# Patient Record
Sex: Male | Born: 1995 | Race: White | Hispanic: No | Marital: Single | Smoking: Current every day smoker
Health system: Southern US, Community
[De-identification: ages and names within clinical notes are randomized; demographics above are authoritative.]

## PROBLEM LIST (undated history)

## (undated) DIAGNOSIS — F319 Bipolar disorder, unspecified: Secondary | ICD-10-CM

## (undated) DIAGNOSIS — D499 Neoplasm of unspecified behavior of unspecified site: Secondary | ICD-10-CM

## (undated) DIAGNOSIS — F909 Attention-deficit hyperactivity disorder, unspecified type: Secondary | ICD-10-CM

## (undated) DIAGNOSIS — G43909 Migraine, unspecified, not intractable, without status migrainosus: Secondary | ICD-10-CM

## (undated) DIAGNOSIS — F419 Anxiety disorder, unspecified: Secondary | ICD-10-CM

## (undated) DIAGNOSIS — F32A Depression, unspecified: Secondary | ICD-10-CM

## (undated) DIAGNOSIS — E119 Type 2 diabetes mellitus without complications: Secondary | ICD-10-CM

## (undated) DIAGNOSIS — F6381 Intermittent explosive disorder: Secondary | ICD-10-CM

## (undated) DIAGNOSIS — F329 Major depressive disorder, single episode, unspecified: Secondary | ICD-10-CM

## (undated) DIAGNOSIS — T1490XA Injury, unspecified, initial encounter: Secondary | ICD-10-CM

## (undated) DIAGNOSIS — C801 Malignant (primary) neoplasm, unspecified: Secondary | ICD-10-CM

## (undated) DIAGNOSIS — R251 Tremor, unspecified: Secondary | ICD-10-CM

## (undated) HISTORY — DX: Type 2 diabetes mellitus without complications: E11.9

## (undated) HISTORY — DX: Injury, unspecified, initial encounter: T14.90XA

## (undated) HISTORY — DX: Anxiety disorder, unspecified: F41.9

## (undated) HISTORY — PX: MOUTH SURGERY: SHX715

## (undated) HISTORY — PX: FRACTURE SURGERY: SHX138

## (undated) HISTORY — DX: Neoplasm of unspecified behavior of unspecified site: D49.9

## (undated) HISTORY — PX: TONSILLECTOMY: SUR1361

## (undated) HISTORY — DX: Depression, unspecified: F32.A

## (undated) HISTORY — PX: ADENOIDECTOMY: SUR15

## (undated) HISTORY — PX: TUMOR REMOVAL: SHX12

## (undated) HISTORY — DX: Bipolar disorder, unspecified: F31.9

## (undated) HISTORY — DX: Intermittent explosive disorder: F63.81

## (undated) HISTORY — DX: Major depressive disorder, single episode, unspecified: F32.9

---
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
## 2017-05-19 ENCOUNTER — Other Ambulatory Visit: Payer: Self-pay

## 2017-05-19 ENCOUNTER — Emergency Department (HOSPITAL_COMMUNITY)
Admission: EM | Admit: 2017-05-19 | Discharge: 2017-05-20 | Disposition: A | Discharge status: Home / Self Care | Payer: Medicaid Other | Attending: Emergency Medicine | Admitting: Emergency Medicine

## 2017-05-19 ENCOUNTER — Emergency Department (HOSPITAL_COMMUNITY): Payer: Medicaid Other

## 2017-05-19 ENCOUNTER — Encounter (HOSPITAL_COMMUNITY): Payer: Self-pay | Admitting: *Deleted

## 2017-05-19 DIAGNOSIS — Z79899 Other long term (current) drug therapy: Secondary | ICD-10-CM | POA: Insufficient documentation

## 2017-05-19 DIAGNOSIS — R519 Headache, unspecified: Secondary | ICD-10-CM

## 2017-05-19 DIAGNOSIS — F17200 Nicotine dependence, unspecified, uncomplicated: Secondary | ICD-10-CM | POA: Insufficient documentation

## 2017-05-19 DIAGNOSIS — R51 Headache: Secondary | ICD-10-CM | POA: Insufficient documentation

## 2017-05-19 HISTORY — DX: Migraine, unspecified, not intractable, without status migrainosus: G43.909

## 2017-05-19 MED ORDER — BUTALBITAL-APAP-CAFFEINE 50-325-40 MG PO TABS: 1.0000 | ORAL_TABLET | Freq: Four times a day (QID) | ORAL | 0 refills | Status: DC | PRN

## 2017-05-19 MED ORDER — KETOROLAC TROMETHAMINE 30 MG/ML IJ SOLN
30.0000 mg | Freq: Once | INTRAMUSCULAR | Status: AC
  Administered 2017-05-19: 30 mg via INTRAVENOUS
  Filled 2017-05-19: qty 1

## 2017-05-19 MED ORDER — METOCLOPRAMIDE HCL 5 MG/ML IJ SOLN
10.0000 mg | Freq: Once | INTRAMUSCULAR | Status: AC
  Administered 2017-05-19: 10 mg via INTRAVENOUS
  Filled 2017-05-19: qty 2

## 2017-05-19 MED ORDER — DIPHENHYDRAMINE HCL 50 MG/ML IJ SOLN
25.0000 mg | Freq: Once | INTRAMUSCULAR | Status: AC
  Administered 2017-05-19: 25 mg via INTRAVENOUS
  Filled 2017-05-19: qty 1

## 2017-05-19 MED ORDER — SODIUM CHLORIDE 0.9 % IV BOLUS (SEPSIS)
1000.0000 mL | Freq: Once | INTRAVENOUS | Status: AC
  Administered 2017-05-19: 1000 mL via INTRAVENOUS

## 2017-05-19 NOTE — ED Triage Notes (Signed)
Pt went to work last night, developed headache, had nose bleed and numbness in rt side of face. Pain in rt face and head. Nose bleed on and off today.

## 2017-05-19 NOTE — ED Provider Notes (Signed)
[facility] DEPT Provider Note   CSN: [id number] Arrival date & time: 05/19/17  1853     History   Chief Complaint Chief Complaint  Patient presents with  . Headache    HPI Danny Werner is a 22 y.o. male.  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  Patient reports gradual onset of throbbing right-sided headache ongoing since last night.  Headache is waxing and waning, initially more intense but has improved.  Headache is currently 7 out of 10, reported tingling sensation to the right side of the face as well as having intermittent nosebleed from the right naris.  Headache is mildly improved with taking Advil.  Headache is different from his usual migraine headache.  No report of fever, vision changes, double vision, URI symptoms, neck pain, chest pain, trouble breathing, arm numbness or weakness or rash.  He has never experiencing tingling sensation to his face before.  Denies any precipitating symptoms contributing to his headache.  Past Medical History:  Diagnosis Date  . ADHD (attention deficit hyperactivity disorder)   . Cancer ([place])   . Migraines     There are no active problems to display for this patient.   Past Surgical History:  Procedure Laterality Date  . ADENOIDECTOMY    . ANAL RECTAL MANOMETRY N/A 01/18/2016   Procedure: ANO RECTAL MANOMETRY;  Surgeon: Arta Silence, MD;  Location: [facility];  Service: Endoscopy;  Laterality: N/A;  . FRACTURE SURGERY    . MOUTH SURGERY    . TONSILLECTOMY         Home Medications    Prior to Admission medications   Medication Sig Start Date End Date Taking? Authorizing Provider  ARIPiprazole (ABILIFY) 5 MG tablet Take 5 mg by mouth daily.    [provider]  dexmethylphenidate (FOCALIN) 5 MG tablet Take 5 mg by mouth daily with lunch. Only on school days    [provider]  GuanFACINE HCl (INTUNIV) 3 MG TB24 Take 1 tablet by mouth daily.    [provider]  HYDROcodone-acetaminophen (NORCO/VICODIN) 5-325 MG per tablet Take 1 tablet by mouth every 6 (six) hours as needed for pain. Patient not taking: Reported on 08/08/2014 09/03/12   Blanchie Dessert, MD  methylphenidate (METADATE CD) 60 MG CR capsule Take 45 mg by mouth every morning.     [provider]    Family History Family History  Problem Relation Age of Onset  . Asthma Mother     Social History Social History   Tobacco Use  . Smoking status: Current Every Day Smoker  . Smokeless tobacco: Never Used  Substance Use Topics  . Alcohol use: No  . Drug use: No     Allergies   Penicillins and Promethazine hcl   Review of Systems Review of Systems  All other systems reviewed and are negative.    Physical Exam Updated Vital Signs BP 130/69 (BP Location: Left Arm)   Pulse 77   Temp 98.5 F (36.9 C) (Oral)   Resp 20   Ht 5\' 3"  (1.6 m)   Wt 87.5 kg (193 lb)   SpO2 99%   BMI 34.19 kg/m   Physical Exam  Constitutional: He is oriented to person, place, and time. He appears well-developed and well-nourished. No distress.  HENT:  Head: Normocephalic and atraumatic.  Mouth/Throat: Oropharynx is clear and moist.  Normal nares, no epistaxis.  Eyes: Conjunctivae and EOM are normal. Pupils are equal, round, and reactive to light.  Neck: Normal range of motion. Neck supple. No neck rigidity.  Cardiovascular: Normal rate and regular rhythm.  Pulmonary/Chest: Effort normal and breath sounds normal.  Abdominal: Soft. There is no tenderness.  Musculoskeletal: Normal range of motion.  Neurological: He is alert and oriented to person, place, and time. He is not disoriented. He displays normal reflexes. A sensory deficit (Subjective hypersensitivity to right side of face on gentle palpation) is present. No cranial nerve deficit. GCS eye subscore is 4. GCS verbal subscore is 5.  GCS motor subscore is 6.  Skin: Skin is warm. No rash noted.  Psychiatric: He has a normal mood and affect.  Nursing note and vitals reviewed.    ED Treatments / Results  Labs (all labs ordered are listed, but only abnormal results are displayed) Labs Reviewed - No data to display  EKG  EKG Interpretation None       Radiology Ct Head Wo Contrast  Result Date: 05/19/2017 CLINICAL DATA:  Pain and right-sided face and head. Intermittent epistaxis. EXAM: CT HEAD WITHOUT CONTRAST TECHNIQUE: Contiguous axial images were obtained from the base of the skull through the vertex without intravenous contrast. COMPARISON:  None. FINDINGS: Brain: No evidence of acute infarction, hemorrhage, hydrocephalus, extra-axial collection or mass lesion/mass effect. Vascular: No hyperdense vessel or unexpected calcification. Skull: Normal. Negative for fracture or focal lesion. Sinuses/Orbits: No acute finding. Other: None. IMPRESSION: Normal study.  No cause for the patient's symptoms identified. Electronically Signed   By: Dorise Bullion III M.D   On: 05/19/2017 23:16    Procedures Procedures (including critical care time)  Medications Ordered in ED Medications  ketorolac (TORADOL) 30 MG/ML injection 30 mg (30 mg Intravenous Given 05/19/17 2340)  diphenhydrAMINE (BENADRYL) injection 25 mg (25 mg Intravenous Given 05/19/17 2339)  metoCLOPramide (REGLAN) injection 10 mg (10 mg Intravenous Given 05/19/17 2338)  sodium chloride 0.9 % bolus 1,000 mL (1,000 mLs Intravenous New Bag/Given 05/19/17 2337)     Initial Impression / Assessment and Plan / ED Course  I have reviewed the triage vital signs and the nursing notes.  Pertinent labs & imaging results that were available during my care of the patient were reviewed by me and considered in my medical decision making (see chart for details).     BP 130/69 (BP Location: Left Arm)   Pulse 77   Temp 98.5 F (36.9 C) (Oral)   Resp 20   Ht 5\' 3"  (1.6 m)   Wt  87.5 kg (193 lb)   SpO2 99%   BMI 34.19 kg/m    Final Clinical Impressions(s) / ED Diagnoses   Final diagnoses:  Bad headache    ED Discharge Orders        Ordered    butalbital-acetaminophen-caffeine (FIORICET, ESGIC) 50-325-40 MG tablet  Every 6 hours PRN     05/19/17 2359     10:41 PM Pt with hx of migraine headache here with headache that felt different from usual.  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  Was told that it's a non aggressive type of tumor and he's been monitoring at [facility].  Given that hx, I plan to obtain head CT to r/o space occupying lesion.  Report intermittent nose bleed, none currently.  Migraine cocktail given.   11:55 PM Head CT scan without any concerning feature.  Patient felt better with treatment.  He is stable for discharge.  Return  precautions discussed.   Domenic Moras, PA-C 05/19/17 2359    Valarie Merino, MD 05/20/17 [phone]

## 2017-10-05 ENCOUNTER — Other Ambulatory Visit: Payer: Self-pay

## 2017-10-05 ENCOUNTER — Observation Stay (HOSPITAL_COMMUNITY)
Admission: EM | Admit: 2017-10-05 | Discharge: 2017-10-07 | Disposition: A | Discharge status: Home / Self Care | Payer: Medicaid Other | Attending: Internal Medicine | Admitting: Internal Medicine

## 2017-10-05 ENCOUNTER — Encounter (HOSPITAL_COMMUNITY): Payer: Self-pay | Admitting: Emergency Medicine

## 2017-10-05 DIAGNOSIS — Z9889 Other specified postprocedural states: Secondary | ICD-10-CM | POA: Insufficient documentation

## 2017-10-05 DIAGNOSIS — F17200 Nicotine dependence, unspecified, uncomplicated: Secondary | ICD-10-CM | POA: Diagnosis not present

## 2017-10-05 DIAGNOSIS — R1031 Right lower quadrant pain: Secondary | ICD-10-CM

## 2017-10-05 DIAGNOSIS — Z79899 Other long term (current) drug therapy: Secondary | ICD-10-CM | POA: Insufficient documentation

## 2017-10-05 DIAGNOSIS — E1165 Type 2 diabetes mellitus with hyperglycemia: Secondary | ICD-10-CM | POA: Diagnosis not present

## 2017-10-05 DIAGNOSIS — R338 Other retention of urine: Secondary | ICD-10-CM | POA: Diagnosis present

## 2017-10-05 DIAGNOSIS — Z880 Allergy status to penicillin: Secondary | ICD-10-CM | POA: Insufficient documentation

## 2017-10-05 DIAGNOSIS — M8560 Other cyst of bone, unspecified site: Secondary | ICD-10-CM | POA: Diagnosis not present

## 2017-10-05 DIAGNOSIS — Z833 Family history of diabetes mellitus: Secondary | ICD-10-CM | POA: Diagnosis not present

## 2017-10-05 DIAGNOSIS — R634 Abnormal weight loss: Secondary | ICD-10-CM | POA: Diagnosis not present

## 2017-10-05 DIAGNOSIS — R112 Nausea with vomiting, unspecified: Secondary | ICD-10-CM

## 2017-10-05 DIAGNOSIS — N2881 Hypertrophy of kidney: Secondary | ICD-10-CM | POA: Diagnosis not present

## 2017-10-05 DIAGNOSIS — Z885 Allergy status to narcotic agent status: Secondary | ICD-10-CM | POA: Insufficient documentation

## 2017-10-05 DIAGNOSIS — R109 Unspecified abdominal pain: Secondary | ICD-10-CM | POA: Diagnosis present

## 2017-10-05 DIAGNOSIS — Z888 Allergy status to other drugs, medicaments and biological substances status: Secondary | ICD-10-CM | POA: Insufficient documentation

## 2017-10-05 DIAGNOSIS — M8540 Solitary bone cyst, unspecified site: Secondary | ICD-10-CM | POA: Diagnosis present

## 2017-10-05 DIAGNOSIS — E119 Type 2 diabetes mellitus without complications: Secondary | ICD-10-CM

## 2017-10-05 DIAGNOSIS — G43909 Migraine, unspecified, not intractable, without status migrainosus: Secondary | ICD-10-CM | POA: Diagnosis not present

## 2017-10-05 DIAGNOSIS — F909 Attention-deficit hyperactivity disorder, unspecified type: Secondary | ICD-10-CM | POA: Diagnosis present

## 2017-10-05 DIAGNOSIS — F319 Bipolar disorder, unspecified: Secondary | ICD-10-CM | POA: Diagnosis not present

## 2017-10-05 LAB — COMPREHENSIVE METABOLIC PANEL
ALT: 16 U/L — ABNORMAL LOW (ref 17–63)
AST: 17 U/L (ref 15–41)
Albumin: 4.4 g/dL (ref 3.5–5.0)
Alkaline Phosphatase: 209 U/L — ABNORMAL HIGH (ref 38–126)
Anion gap: 11 (ref 5–15)
BUN: 15 mg/dL (ref 6–20)
CHLORIDE: 100 mmol/L — AB (ref 101–111)
CO2: 28 mmol/L (ref 22–32)
Calcium: 9.6 mg/dL (ref 8.9–10.3)
Creatinine, Ser: 0.8 mg/dL (ref 0.61–1.24)
GFR calc Af Amer: >60 mL/min (ref >60)
GFR calc non Af Amer: >60 mL/min (ref >60)
Glucose, Bld: 582 mg/dL (ref 65–99)
Potassium: 3.9 mmol/L (ref 3.5–5.1)
SODIUM: 139 mmol/L (ref 135–145)
Total Bilirubin: 1.3 mg/dL — ABNORMAL HIGH (ref 0.3–1.2)
Total Protein: 7.4 g/dL (ref 6.5–8.1)

## 2017-10-05 LAB — LIPASE, BLOOD: Lipase: 33 U/L (ref 11–51)

## 2017-10-05 MED ORDER — MORPHINE SULFATE (PF) 4 MG/ML IV SOLN
4.0000 mg | Freq: Once | INTRAVENOUS | Status: AC
  Administered 2017-10-05: 4 mg via INTRAVENOUS
  Filled 2017-10-05: qty 1

## 2017-10-05 MED ORDER — SODIUM CHLORIDE 0.9 % IV BOLUS
1000.0000 mL | Freq: Once | INTRAVENOUS | Status: AC
  Administered 2017-10-05: 1000 mL via INTRAVENOUS

## 2017-10-05 MED ORDER — IOPAMIDOL (ISOVUE-300) INJECTION 61%
INTRAVENOUS | Status: AC
  Filled 2017-10-05: qty 100

## 2017-10-05 MED ORDER — ONDANSETRON HCL 4 MG/2ML IJ SOLN
4.0000 mg | Freq: Once | INTRAMUSCULAR | Status: AC
  Administered 2017-10-05: 4 mg via INTRAVENOUS
  Filled 2017-10-05: qty 2

## 2017-10-05 NOTE — ED Provider Notes (Signed)
Patient signed out to me at shift change.  Patient with right flank pain and right lower quadrant pain for several days.  Reports associated nausea and vomiting.  Reports night sweats.  Weight loss of 50 pounds in the last 3 weeks.  Polyuria polydipsia.  Pending labs and CT abdomen pelvis for further evaluation.   Patient's glucose came back at 582.  Most likely new onset of diabetes.  Anion gap is normal, 11.  The rest of the electrolytes are unremarkable.  CBC is normal.  CT scan showed distended bladder with dilated renal pelvises, may be related to patient's flank pain.  Postvoid residual obtained and showed patient had over 400 cc of urine after voiding in his bladder.  I will order placement of Foley catheter for now.  He will need urology consult.  Patient received 2 L of IV fluids, recheck CBG is 349.  I will order insulin.  Given new onset diabetes is a 22 year old, question type 1 diabetes.  He will need further inpatient work-up as well as initiation of treatment.  Patient is also telling me that he was seen last week at [facility] emergency department for bee sting where he received apparently EpiPen and steroids.  Do not see any records of that in our chart.  I tried to discharge patient by his name to see if he has another medical record number but unable to find him at this time.  Will call for admission.    Jeannett Senior, PA-C 10/06/17 Sesser, [place], MD 10/06/17 [phone]

## 2017-10-05 NOTE — ED Notes (Signed)
Patient is aware that we need a urine sample.    

## 2017-10-05 NOTE — ED Triage Notes (Signed)
Pt arriving with right sided flank pain that began 1.5 hours ago. Pt denies taking anything for pain prior to arrival. N/V. Denies any urinary symptoms.

## 2017-10-05 NOTE — ED Provider Notes (Signed)
[facility] DEPT Provider Note   CSN: [id number] Arrival date & time: 10/05/17  2058     History   Chief Complaint Chief Complaint  Patient presents with  . Flank Pain    HPI Danny Werner is a 22 y.o. male.  Danny Werner is a 22 y.o. Male with a history of ADHD, migraines and left humeral unicameral bone cyst followed by [facility], who presents to the emergency department for evaluation of acute onset right flank and right lower quadrant abdominal pain that started this afternoon he reports pain is a constant dull ache that occasionally becomes more sharp.  Patient reports associated nausea with one episode of nonbloody emesis.  He denies associated fevers, no diarrhea, constipation, hematochezia or melena.  He denies any dysuria or urinary frequency.  Personal history of kidney stones, but family history.  Patient's mother is at the bedside and also notes that patient has lost probably about 55 pounds in the last 3 weeks without trying, he is on ADHD medications but these are not new and has not been recently changed.  He reports some intermittent night sweats, but denies fevers or chills, has not been experiencing any abdominal pain over the last several weeks.  Mom does report some intermittent vomiting but she attributed this to him choking drinks very quickly.     Past Medical History:  Diagnosis Date  . ADHD (attention deficit hyperactivity disorder)   . Cancer ([place])    tumor in left arm  . Migraines     There are no active problems to display for this patient.   Past Surgical History:  Procedure Laterality Date  . ADENOIDECTOMY    . ANAL RECTAL MANOMETRY N/A 01/18/2016   Procedure: ANO RECTAL MANOMETRY;  Surgeon: Arta Silence, MD;  Location: [facility];  Service: Endoscopy;  Laterality: N/A;  . FRACTURE SURGERY    . MOUTH SURGERY    . TONSILLECTOMY          Home Medications    Prior to Admission medications   Medication Sig  Start Date End Date Taking? Authorizing Provider  ARIPiprazole (ABILIFY) 5 MG tablet Take 5 mg by mouth daily.    [provider]  butalbital-acetaminophen-caffeine (FIORICET, ESGIC) 50-325-40 MG tablet Take 1 tablet by mouth every 6 (six) hours as needed for headache. 05/19/17 05/19/18  Domenic Moras, PA-C  dexmethylphenidate (FOCALIN) 5 MG tablet Take 5 mg by mouth daily with lunch. Only on school days    [provider]  GuanFACINE HCl (INTUNIV) 3 MG TB24 Take 1 tablet by mouth daily.    [provider]  methylphenidate (METADATE CD) 60 MG CR capsule Take 45 mg by mouth every morning.     [provider]    Family History Family History  Problem Relation Age of Onset  . Asthma Mother     Social History Social History   Tobacco Use  . Smoking status: Current Every Day Smoker  . Smokeless tobacco: Never Used  Substance Use Topics  . Alcohol use: No  . Drug use: No     Allergies   Codeine; Penicillins; and Promethazine hcl   Review of Systems Review of Systems  Constitutional: Positive for unexpected weight change. Negative for chills and fever.  HENT: Negative for congestion, sinus pain and sore throat.   Eyes: Negative for visual disturbance.  Respiratory: Negative for cough and shortness of breath.   Cardiovascular: Negative for chest pain.  Gastrointestinal: Positive for abdominal pain,  nausea and vomiting. Negative for blood in stool, constipation and diarrhea.  Genitourinary: Negative for dysuria and frequency.  Musculoskeletal: Negative for arthralgias and joint swelling.  Skin: Negative for color change and rash.  Neurological: Negative for syncope and light-headedness.  All other systems reviewed and are negative.    Physical Exam Updated Vital Signs BP 119/75 (BP Location: Right Arm)   Pulse 82   Temp 98.1 F (36.7 C) (Oral)   Resp 17   Ht 5\' 2"  (1.575 m)   Wt 58.5 kg (129 lb)   SpO2 95%   BMI 23.59 kg/m   Physical  Exam  Constitutional: He is oriented to person, place, and time. He appears well-developed and well-nourished. No distress.  HENT:  Head: Normocephalic and atraumatic.  Left Ear: External ear normal.  Eyes: Right eye exhibits no discharge. Left eye exhibits no discharge.  Neck: Neck supple.  Cardiovascular: Normal rate, regular rhythm, normal heart sounds and intact distal pulses.  Pulmonary/Chest: Effort normal and breath sounds normal. No stridor. No respiratory distress. He has no wheezes. He has no rales.  Respirations equal and unlabored, patient able to speak in full sentences, lungs clear to auscultation bilaterally  Abdominal: Soft. Bowel sounds are normal. He exhibits distension. He exhibits no mass. There is tenderness. There is guarding. There is no rebound.  Abdomen soft, nondistended, bowel sounds present throughout, focal tenderness in the right lower quadrant with guarding, no rebound tenderness, all other quadrants nontender to palpation, there is no CVA tenderness bilaterally  Musculoskeletal: He exhibits no edema or deformity.  Neurological: He is alert and oriented to person, place, and time. Coordination normal.  Skin: Skin is warm and dry. Capillary refill takes less than 2 seconds. He is not diaphoretic.  Psychiatric: He has a normal mood and affect. His behavior is normal.  Nursing note and vitals reviewed.    ED Treatments / Results  Labs (all labs ordered are listed, but only abnormal results are displayed) Labs Reviewed  COMPREHENSIVE METABOLIC PANEL  LIPASE, BLOOD  CBC WITH DIFFERENTIAL/PLATELET  URINALYSIS, ROUTINE W REFLEX MICROSCOPIC    EKG None  Radiology No results found.  Procedures Procedures (including critical care time)  Medications Ordered in ED Medications  iopamidol (ISOVUE-300) 61 % injection (has no administration in time range)  iopamidol (ISOVUE-300) 61 % injection 100 mL (has no administration in time range)  sodium chloride 0.9  % bolus 1,000 mL (1,000 mLs Intravenous New Bag/Given 10/05/17 2257)  ondansetron (ZOFRAN) injection 4 mg (4 mg Intravenous Given 10/05/17 2304)  morphine 4 MG/ML injection 4 mg (4 mg Intravenous Given 10/05/17 2304)     Initial Impression / Assessment and Plan / ED Course  I have reviewed the triage vital signs and the nursing notes.  Pertinent labs & imaging results that were available during my care of the patient were reviewed by me and considered in my medical decision making (see chart for details).  Patient presents to the ED for evaluation of acute onset of right lower quadrant abdominal pain with associated nausea and vomiting.  No fevers or chills, no diarrhea or blood in the stool, no urinary symptoms.  Mom also notes approximate 55 pound weight loss in the last 3 weeks, patient reports some associated night sweats. vital signs are normal and patient appears uncomfortable but in no acute distress.  Abdomen is focally tender in the right lower quadrant.  Concerning for appendicitis versus kidney stone versus other acute intra-abdominal pathology.  Unclear etiology for night  sweats and weight loss, patient does have history of unicameral bone cyst which has been monitored closely but no history of malignancy.  Will get abdominal labs and CT abdomen pelvis with contrast.  Patient given IV fluids, morphine and Zofran for symptomatic management.  At shift change care signed out to PA [facility] who will will follow up on lab work and CT scan and disposition appropriately.  Final Clinical Impressions(s) / ED Diagnoses   Final diagnoses:  RLQ abdominal pain  Non-intractable vomiting with nausea, unspecified vomiting type  Weight loss    ED Discharge Orders    None       Janet Berlin 10/06/17 0037    Tanna Furry, MD 10/10/17 1446

## 2017-10-06 ENCOUNTER — Encounter (HOSPITAL_COMMUNITY): Payer: Self-pay | Admitting: *Deleted

## 2017-10-06 ENCOUNTER — Emergency Department (HOSPITAL_COMMUNITY): Payer: Medicaid Other

## 2017-10-06 DIAGNOSIS — M8540 Solitary bone cyst, unspecified site: Secondary | ICD-10-CM | POA: Diagnosis not present

## 2017-10-06 DIAGNOSIS — F909 Attention-deficit hyperactivity disorder, unspecified type: Secondary | ICD-10-CM | POA: Diagnosis present

## 2017-10-06 DIAGNOSIS — E119 Type 2 diabetes mellitus without complications: Secondary | ICD-10-CM | POA: Diagnosis not present

## 2017-10-06 DIAGNOSIS — R338 Other retention of urine: Secondary | ICD-10-CM | POA: Diagnosis not present

## 2017-10-06 DIAGNOSIS — R634 Abnormal weight loss: Secondary | ICD-10-CM | POA: Diagnosis not present

## 2017-10-06 DIAGNOSIS — R109 Unspecified abdominal pain: Secondary | ICD-10-CM | POA: Diagnosis present

## 2017-10-06 DIAGNOSIS — R112 Nausea with vomiting, unspecified: Secondary | ICD-10-CM

## 2017-10-06 DIAGNOSIS — R1031 Right lower quadrant pain: Secondary | ICD-10-CM | POA: Diagnosis not present

## 2017-10-06 LAB — HEMOGLOBIN A1C
HEMOGLOBIN A1C: 13.6 % — AB (ref 4.8–5.6)
Mean Plasma Glucose: 343.62 mg/dL

## 2017-10-06 LAB — COMPREHENSIVE METABOLIC PANEL
ALT: 13 U/L — ABNORMAL LOW (ref 17–63)
AST: 15 U/L (ref 15–41)
Albumin: 3.7 g/dL (ref 3.5–5.0)
Alkaline Phosphatase: 144 U/L — ABNORMAL HIGH (ref 38–126)
Anion gap: 9 (ref 5–15)
BUN: 13 mg/dL (ref 6–20)
CO2: 26 mmol/L (ref 22–32)
Calcium: 8.8 mg/dL — ABNORMAL LOW (ref 8.9–10.3)
Chloride: 110 mmol/L (ref 101–111)
Creatinine, Ser: 0.66 mg/dL (ref 0.61–1.24)
GFR calc Af Amer: >60 mL/min (ref >60)
GFR calc non Af Amer: >60 mL/min (ref >60)
Glucose, Bld: 282 mg/dL — ABNORMAL HIGH (ref 65–99)
Potassium: 3.7 mmol/L (ref 3.5–5.1)
Sodium: 145 mmol/L (ref 135–145)
Total Bilirubin: 0.8 mg/dL (ref 0.3–1.2)
Total Protein: 6.1 g/dL — ABNORMAL LOW (ref 6.5–8.1)

## 2017-10-06 LAB — URINALYSIS, ROUTINE W REFLEX MICROSCOPIC
Bilirubin Urine: NEGATIVE
Glucose, UA: >=500 mg/dL — AB
Hgb urine dipstick: NEGATIVE
Ketones, ur: >80 mg/dL — AB
Leukocytes, UA: NEGATIVE
NITRITE: NEGATIVE
PROTEIN: NEGATIVE mg/dL
Specific Gravity, Urine: <1.005 — ABNORMAL LOW (ref 1.005–1.030)
pH: 6 (ref 5.0–8.0)

## 2017-10-06 LAB — CBC WITH DIFFERENTIAL/PLATELET
BASOS ABS: 0.1 10*3/uL (ref 0.0–0.1)
BASOS PCT: 2 %
EOS ABS: 0.1 10*3/uL (ref 0.0–0.7)
EOS PCT: 2 %
HCT: 44.9 % (ref 39.0–52.0)
Hemoglobin: 16.4 g/dL (ref 13.0–17.0)
LYMPHS ABS: 1.5 10*3/uL (ref 0.7–4.0)
Lymphocytes Relative: 25 %
MCH: 30.4 pg (ref 26.0–34.0)
MCHC: 36.5 g/dL — ABNORMAL HIGH (ref 30.0–36.0)
MCV: 83.1 fL (ref 78.0–100.0)
Monocytes Absolute: 0.5 10*3/uL (ref 0.1–1.0)
Monocytes Relative: 8 %
NEUTROS PCT: 63 %
Neutro Abs: 3.8 10*3/uL (ref 1.7–7.7)
PLATELETS: 305 10*3/uL (ref 150–400)
RBC: 5.4 MIL/uL (ref 4.22–5.81)
RDW: 12.4 % (ref 11.5–15.5)
WBC: 6 10*3/uL (ref 4.0–10.5)

## 2017-10-06 LAB — URINALYSIS, MICROSCOPIC (REFLEX)
Bacteria, UA: NONE SEEN
RBC / HPF: NONE SEEN RBC/hpf (ref 0–5)

## 2017-10-06 LAB — CBC
HCT: 40.4 % (ref 39.0–52.0)
Hemoglobin: 14.2 g/dL (ref 13.0–17.0)
MCH: 30 pg (ref 26.0–34.0)
MCHC: 35.1 g/dL (ref 30.0–36.0)
MCV: 85.4 fL (ref 78.0–100.0)
Platelets: 269 10*3/uL (ref 150–400)
RBC: 4.73 MIL/uL (ref 4.22–5.81)
RDW: 12.4 % (ref 11.5–15.5)
WBC: 6.7 10*3/uL (ref 4.0–10.5)

## 2017-10-06 LAB — SEDIMENTATION RATE: Sed Rate: 0 mm/hr (ref 0–16)

## 2017-10-06 LAB — GLUCOSE, CAPILLARY
GLUCOSE-CAPILLARY: 254 mg/dL — AB (ref 65–99)
GLUCOSE-CAPILLARY: 147 mg/dL — AB (ref 65–99)
Glucose-Capillary: 103 mg/dL — ABNORMAL HIGH (ref 65–99)
Glucose-Capillary: 224 mg/dL — ABNORMAL HIGH (ref 65–99)
Glucose-Capillary: 297 mg/dL — ABNORMAL HIGH (ref 65–99)

## 2017-10-06 LAB — TSH: TSH: 2.07 u[IU]/mL (ref 0.350–4.500)

## 2017-10-06 LAB — CBG MONITORING, ED: Glucose-Capillary: 349 mg/dL — ABNORMAL HIGH (ref 65–99)

## 2017-10-06 LAB — HIV ANTIBODY (ROUTINE TESTING W REFLEX): HIV SCREEN 4TH GENERATION: NONREACTIVE

## 2017-10-06 MED ORDER — ACETAMINOPHEN 325 MG PO TABS
650.0000 mg | ORAL_TABLET | Freq: Four times a day (QID) | ORAL | Status: DC | PRN
  Administered 2017-10-06 (×2): 650 mg via ORAL
  Filled 2017-10-06 (×2): qty 2

## 2017-10-06 MED ORDER — SODIUM CHLORIDE 0.9 % IV BOLUS
1000.0000 mL | Freq: Once | INTRAVENOUS | Status: AC
  Administered 2017-10-06: 1000 mL via INTRAVENOUS

## 2017-10-06 MED ORDER — INSULIN DETEMIR 100 UNIT/ML ~~LOC~~ SOLN
10.0000 [IU] | Freq: Every day | SUBCUTANEOUS | Status: DC
  Administered 2017-10-06: 10 [IU] via SUBCUTANEOUS
  Filled 2017-10-06 (×2): qty 0.1

## 2017-10-06 MED ORDER — INSULIN ASPART 100 UNIT/ML ~~LOC~~ SOLN
0.0000 [IU] | Freq: Three times a day (TID) | SUBCUTANEOUS | Status: DC
  Administered 2017-10-06: 5 [IU] via SUBCUTANEOUS

## 2017-10-06 MED ORDER — ONDANSETRON HCL 4 MG/2ML IJ SOLN: 4.0000 mg | Freq: Four times a day (QID) | INTRAMUSCULAR | Status: DC | PRN

## 2017-10-06 MED ORDER — ENOXAPARIN SODIUM 40 MG/0.4ML ~~LOC~~ SOLN
40.0000 mg | SUBCUTANEOUS | Status: DC
  Administered 2017-10-06 – 2017-10-07 (×2): 40 mg via SUBCUTANEOUS
  Filled 2017-10-06 (×2): qty 0.4

## 2017-10-06 MED ORDER — ALBUTEROL SULFATE (2.5 MG/3ML) 0.083% IN NEBU: 2.5000 mg | INHALATION_SOLUTION | Freq: Four times a day (QID) | RESPIRATORY_TRACT | Status: DC | PRN

## 2017-10-06 MED ORDER — INSULIN ASPART 100 UNIT/ML ~~LOC~~ SOLN
6.0000 [IU] | Freq: Once | SUBCUTANEOUS | Status: AC
  Administered 2017-10-06: 6 [IU] via SUBCUTANEOUS
  Filled 2017-10-06: qty 1

## 2017-10-06 MED ORDER — LIVING WELL WITH DIABETES BOOK
Freq: Once | Status: AC
  Administered 2017-10-06: 10:00:00
  Filled 2017-10-06: qty 1

## 2017-10-06 MED ORDER — SODIUM CHLORIDE 0.9 % IV SOLN
Freq: Once | INTRAVENOUS | Status: AC
  Administered 2017-10-06: 04:00:00 via INTRAVENOUS

## 2017-10-06 MED ORDER — INSULIN ASPART 100 UNIT/ML ~~LOC~~ SOLN: 0.0000 [IU] | Freq: Every day | SUBCUTANEOUS | Status: DC

## 2017-10-06 MED ORDER — IOPAMIDOL (ISOVUE-300) INJECTION 61%
100.0000 mL | Freq: Once | INTRAVENOUS | Status: AC | PRN
  Administered 2017-10-06: 100 mL via INTRAVENOUS

## 2017-10-06 MED ORDER — INSULIN ASPART 100 UNIT/ML ~~LOC~~ SOLN
0.0000 [IU] | Freq: Three times a day (TID) | SUBCUTANEOUS | Status: DC
  Administered 2017-10-06: 5 [IU] via SUBCUTANEOUS
  Administered 2017-10-06: 1 [IU] via SUBCUTANEOUS
  Administered 2017-10-07: 3 [IU] via SUBCUTANEOUS
  Administered 2017-10-07: 5 [IU] via SUBCUTANEOUS

## 2017-10-06 MED ORDER — INSULIN ASPART 100 UNIT/ML ~~LOC~~ SOLN
5.0000 [IU] | Freq: Three times a day (TID) | SUBCUTANEOUS | Status: DC
  Administered 2017-10-06 – 2017-10-07 (×4): 5 [IU] via SUBCUTANEOUS

## 2017-10-06 MED ORDER — SERTRALINE HCL 50 MG PO TABS
50.0000 mg | ORAL_TABLET | Freq: Every day | ORAL | Status: DC
  Administered 2017-10-06 – 2017-10-07 (×2): 50 mg via ORAL
  Filled 2017-10-06 (×2): qty 1

## 2017-10-06 MED ORDER — ARIPIPRAZOLE 5 MG PO TABS
5.0000 mg | ORAL_TABLET | Freq: Every day | ORAL | Status: DC
  Administered 2017-10-06 – 2017-10-07 (×2): 5 mg via ORAL
  Filled 2017-10-06 (×3): qty 1

## 2017-10-06 MED ORDER — INSULIN STARTER KIT- PEN NEEDLES (ENGLISH)
1.0000 | Freq: Once | Status: AC
  Administered 2017-10-06: 1
  Filled 2017-10-06: qty 1

## 2017-10-06 MED ORDER — ACETAMINOPHEN 650 MG RE SUPP: 650.0000 mg | Freq: Four times a day (QID) | RECTAL | Status: DC | PRN

## 2017-10-06 MED ORDER — ONDANSETRON HCL 4 MG PO TABS: 4.0000 mg | ORAL_TABLET | Freq: Four times a day (QID) | ORAL | Status: DC | PRN

## 2017-10-06 NOTE — ED Notes (Signed)
Patient transported to CT 

## 2017-10-06 NOTE — ED Notes (Signed)
ED TO INPATIENT HANDOFF REPORT  Name/Age/Gender Danny Werner 22 y.o. male  Code Status    Code Status Orders  (From admission, onward)        Start     Ordered   10/06/17 0213  Full code  Continuous     10/06/17 0214    Code Status History    This patient has a current code status but no historical code status.      Home/SNF/Other Home  Chief Complaint Sharp Right Flank Pain   Level of Care/Admitting Diagnosis ED Disposition    ED Disposition Condition Comment   Admit  Hospital Area: [facility] [[id number]]  Level of Care: Med-Surg [16]  Diagnosis: Diabetes mellitus, new onset [facility]) [885027]  Admitting Physician: Norval Morton [[id number]]  Attending Physician: Norval Morton [[id number]]  PT Class (Do Not Modify): Observation [104]  PT Acc Code (Do Not Modify): Observation [10022]       Medical History Past Medical History:  Diagnosis Date  . ADHD (attention deficit hyperactivity disorder)   . 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 Migraines     Allergies Allergies  Allergen Reactions  . Codeine   . Penicillins   . Promethazine Hcl Other (See Comments)    Seizures.    IV Location/Drains/Wounds Patient Lines/Drains/Airways Status   Active Line/Drains/Airways    Name:   Placement date:   Placement time:   Site:   Days:   Peripheral IV 05/19/17 Right;Posterior Hand   05/19/17    2337    Hand   140   Peripheral IV 10/05/17 Right Antecubital   10/05/17    2254    Antecubital   1   Urethral Catheter Rene Paci, RN Straight-tip 16 Fr.   10/06/17    0232    Straight-tip   less than 1          Labs/Imaging Results for orders placed or performed during the hospital encounter of 10/05/17 (from the past 48 hour(s))  Urinalysis, Routine w reflex microscopic     Status: Abnormal   Collection Time: 10/05/17 10:28 PM  Result Value Ref Range   Color, Urine YELLOW YELLOW   APPearance CLEAR CLEAR   Specific Gravity, Urine <1.005  (L) 1.005 - 1.030   pH 6.0 5.0 - 8.0   Glucose, UA >=500 (A) NEGATIVE mg/dL   Hgb urine dipstick NEGATIVE NEGATIVE   Bilirubin Urine NEGATIVE NEGATIVE   Ketones, ur >80 (A) NEGATIVE mg/dL   Protein, ur NEGATIVE NEGATIVE mg/dL   Nitrite NEGATIVE NEGATIVE   Leukocytes, UA NEGATIVE NEGATIVE    Comment: Performed at [facility], [place] [address] [place], [place] [address]  Urinalysis, Microscopic (reflex)     Status: None   Collection Time: 10/05/17 10:28 PM  Result Value Ref Range   RBC / HPF NONE SEEN 0 - 5 RBC/hpf   WBC, UA 0-5 0 - 5 WBC/hpf   Bacteria, UA NONE SEEN NONE SEEN   Squamous Epithelial / LPF 0-5 0 - 5   Mucus PRESENT     Comment: Performed at [facility], [place] [address] [place], [place] [address]  CBC with Differential     Status: Abnormal   Collection Time: 10/05/17 10:55 PM  Result Value Ref Range   WBC 6.0 4.0 - 10.5 K/uL   RBC 5.40 4.22 - 5.81 MIL/uL   Hemoglobin 16.4 13.0 - 17.0 g/dL   HCT 44.9 39.0 -  52.0 %   MCV 83.1 78.0 - 100.0 fL   MCH 30.4 26.0 - 34.0 pg   MCHC 36.5 (H) 30.0 - 36.0 g/dL   RDW 12.4 11.5 - 15.5 %   Platelets 305 150 - 400 K/uL   Neutrophils Relative % 63 %   Neutro Abs 3.8 1.7 - 7.7 K/uL   Lymphocytes Relative 25 %   Lymphs Abs 1.5 0.7 - 4.0 K/uL   Monocytes Relative 8 %   Monocytes Absolute 0.5 0.1 - 1.0 K/uL   Eosinophils Relative 2 %   Eosinophils Absolute 0.1 0.0 - 0.7 K/uL   Basophils Relative 2 %   Basophils Absolute 0.1 0.0 - 0.1 K/uL    Comment: Performed at [facility], [place] [address], [place], [place] [address]  Comprehensive metabolic panel     Status: Abnormal   Collection Time: 10/05/17 10:55 PM  Result Value Ref Range   Sodium 139 135 - 145 mmol/L   Potassium 3.9 3.5 - 5.1 mmol/L   Chloride 100 (L) 101 - 111 mmol/L   CO2 28 22 - 32 mmol/L   Glucose, Bld 582 (HH) 65 - 99 mg/dL    Comment: CRITICAL RESULT CALLED TO, READ BACK BY AND VERIFIED WITH: COLES,L AT  2352 ON 10/05/2017 BY MOSLEY,J    BUN 15 6 - 20 mg/dL   Creatinine, Ser 0.80 0.61 - 1.24 mg/dL   Calcium 9.6 8.9 - 10.3 mg/dL   Total Protein 7.4 6.5 - 8.1 g/dL   Albumin 4.4 3.5 - 5.0 g/dL   AST 17 15 - 41 U/L   ALT 16 (L) 17 - 63 U/L   Alkaline Phosphatase 209 (H) 38 - 126 U/L   Total Bilirubin 1.3 (H) 0.3 - 1.2 mg/dL   GFR calc non Af Amer >60 >60 mL/min   GFR calc Af Amer >60 >60 mL/min    Comment: (NOTE) The eGFR has been calculated using the CKD EPI equation. This calculation has not been validated in all clinical situations. eGFR's persistently <60 mL/min signify possible Chronic Kidney Disease.    Anion gap 11 5 - 15    Comment: Performed at [facility], [place] [address], [place], [place] [address]  Lipase, blood     Status: None   Collection Time: 10/05/17 10:55 PM  Result Value Ref Range   Lipase 33 11 - 51 U/L    Comment: Performed at [facility], [place] [address], [place],  [address]  POC CBG, ED     Status: Abnormal   Collection Time: 10/06/17  1:23 AM  Result Value Ref Range   Glucose-Capillary 349 (H) 65 - 99 mg/dL   Ct Abdomen Pelvis W Contrast  Result Date: 10/06/2017 CLINICAL DATA:  Right-sided abdominal pain EXAM: CT ABDOMEN AND PELVIS WITH CONTRAST TECHNIQUE: Multidetector CT imaging of the abdomen and pelvis was performed using the standard protocol following bolus administration of intravenous contrast. CONTRAST:  162m ISOVUE-300 IOPAMIDOL (ISOVUE-300) INJECTION 61% COMPARISON:  None. FINDINGS: Lower chest: No acute abnormality. Hepatobiliary: No focal liver abnormality is seen. No gallstones, gallbladder wall thickening, or biliary dilatation. Pancreas: Unremarkable. No pancreatic ductal dilatation or surrounding inflammatory changes. Spleen: Normal in size without focal abnormality. Adrenals/Urinary Tract: Adrenal glands are within normal limits. Enlarged renal pelvises and prominent ureters with markedly distended  urinary bladder. Stomach/Bowel: Stomach is within normal limits. Appendix appears normal. No evidence of bowel wall thickening, distention, or inflammatory changes. Vascular/Lymphatic: No significant vascular findings are present.  No enlarged abdominal or pelvic lymph nodes. Reproductive: Prostate is unremarkable. Other: Negative for free air or free fluid Musculoskeletal: No acute or significant osseous findings. IMPRESSION: 1. No CT evidence for acute intra-abdominal or pelvic abnormality. Negative for appendicitis. 2. Markedly enlarged urinary bladder. Dilated renal pelvises bilaterally with slightly enlarged ureters, possibly due to over distention of the bladder. Electronically Signed   By: Donavan Foil M.D.   On: 10/06/2017 00:55    Pending Labs Unresulted Labs (From admission, onward)   Start     Ordered   10/06/17 0500  HIV antibody (Routine Testing)  Tomorrow morning,   R     10/06/17 0214   10/06/17 0500  Comprehensive metabolic panel  Tomorrow morning,   R     10/06/17 0214   10/06/17 0500  CBC  Tomorrow morning,   R     10/06/17 0214   10/06/17 0500  Hemoglobin A1c  Tomorrow morning,   R    Comments:  To assess prior glycemic control    10/06/17 0214   10/06/17 0152  Urine culture  STAT,   STAT     10/06/17 0151      Vitals/Pain Today's Vitals   10/05/17 2141 10/05/17 2153 10/05/17 2230 10/06/17 0010  BP:  119/75 117/64 117/70  Pulse:  82 91 74  Resp:  17  17  Temp:      TempSrc:      SpO2:  95% 95% 96%  Weight: 129 lb (58.5 kg)     Height: 5' 2" (1.575 m)     PainSc:        Isolation Precautions No active isolations  Medications Medications  iopamidol (ISOVUE-300) 61 % injection (has no administration in time range)  0.9 %  sodium chloride infusion (has no administration in time range)  enoxaparin (LOVENOX) injection 40 mg (has no administration in time range)  ondansetron (ZOFRAN) tablet 4 mg (has no administration in time range)    Or  ondansetron (ZOFRAN)  injection 4 mg (has no administration in time range)  acetaminophen (TYLENOL) tablet 650 mg (has no administration in time range)    Or  acetaminophen (TYLENOL) suppository 650 mg (has no administration in time range)  albuterol (PROVENTIL) (2.5 MG/3ML) 0.083% nebulizer solution 2.5 mg (has no administration in time range)  insulin aspart (novoLOG) injection 0-9 Units (has no administration in time range)  sodium chloride 0.9 % bolus 1,000 mL (0 mLs Intravenous Stopped 10/06/17 0020)  ondansetron (ZOFRAN) injection 4 mg (4 mg Intravenous Given 10/05/17 2304)  morphine 4 MG/ML injection 4 mg (4 mg Intravenous Given 10/05/17 2304)  iopamidol (ISOVUE-300) 61 % injection 100 mL (100 mLs Intravenous Contrast Given 10/06/17 0014)  sodium chloride 0.9 % bolus 1,000 mL (0 mLs Intravenous Stopped 10/06/17 0146)  insulin aspart (novoLOG) injection 6 Units (6 Units Subcutaneous Given 10/06/17 0145)    Mobility walks

## 2017-10-06 NOTE — H&P (Addendum)
History and Physical    Danny Werner JJO:[id number] DOB: 12/17/1995 DOA: 10/05/2017  Referring MD/NP/PA: Dr. Melynda Ripple, PA-C PCP: Beverly Sessions  Patient coming from: Home  Chief Complaint: Right-sided abdominal pain  I have personally briefly reviewed patient's old medical records in [place]   HPI: Danny Werner is a 22 y.o. male with medical history significant of unicameral bone cyst followed at [facility], ADHD, bipolar disorder, depression, headaches; who presents with complaints of describes as acute onset of lower right-sidedabdominal pain.  Pain pain was constant and intermittently sharp with radiation to his right flank.  He was nauseous with nonbloody nonbilious emesis.  Associated symptoms which appear to have been ongoing for longer period in time included frequency of urination, urgency, polydipsia, general malaise, and weight loss of more than 60 pounds.  Patient previously weighed around 195 pounds, but over the last several weeks had dropped down to around 130.  Family history is significant of diabetes.  Denies any recent changes in any of his medications, fever, chills, cough, shortness of breath, diarrhea, or recent trauma.  He has been on Abilify and Zoloft for many years.  ED Course: Upon admission to the emergency department patient was seen to have normal vital signs.  Labs revealed normal CBC, glucose 582, anion gap 11, lipase 33, alkaline phosphatase 209, and total bilirubin 1.3.  CT scan of the abdomen and pelvis revealed markedly enlarged urinary bladder with dilated renal pelvis slightly enlarged ureters.  Patient received 2 L of normal saline IV fluids, Zofran, 4 mg of morphine, and 6 units of insulin while in the ED.  Review of Systems  Constitutional: Positive for malaise/fatigue and weight loss. Negative for chills and fever.  HENT: Negative for congestion and sinus pain.   Eyes: Negative for double vision and photophobia.  Respiratory: Negative for cough  and shortness of breath.   Cardiovascular: Negative for chest pain and leg swelling.  Gastrointestinal: Positive for abdominal pain, nausea and vomiting.  Genitourinary: Positive for flank pain, frequency and urgency.  Musculoskeletal: Negative for falls and myalgias.  Skin: Negative for rash.  Neurological: Positive for weakness. Negative for speech change and focal weakness.  Endo/Heme/Allergies: Positive for polydipsia. Does not bruise/bleed easily.  Psychiatric/Behavioral: Negative for substance abuse and suicidal ideas.    Past Medical History:  Diagnosis Date  . ADHD (attention deficit hyperactivity disorder)   . 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 Migraines     Past Surgical History:  Procedure Laterality Date  . ADENOIDECTOMY    . ANAL RECTAL MANOMETRY N/A 01/18/2016   Procedure: ANO RECTAL MANOMETRY;  Surgeon: Arta Silence, MD;  Location: [facility] ENDOSCOPY;  Service: Endoscopy;  Laterality: N/A;  . FRACTURE SURGERY    . MOUTH SURGERY    . TONSILLECTOMY       reports that he has been smoking.  He has never used smokeless tobacco. He reports that he does not drink alcohol or use drugs.  Allergies  Allergen Reactions  . Codeine   . Penicillins   . Promethazine Hcl Other (See Comments)    Seizures.    Family History  Problem Relation Age of Onset  . Asthma Mother   Patient reports diabetes runs in his family.  Prior to Admission medications   Medication Sig Start Date End Date Taking? Authorizing Provider  ARIPiprazole (ABILIFY) 5 MG tablet Take 5 mg by mouth daily.    [provider]  butalbital-acetaminophen-caffeine (FIORICET, ESGIC) [phone] MG tablet  Take 1 tablet by mouth every 6 (six) hours as needed for headache. 05/19/17 05/19/18  Domenic Moras, PA-C  dexmethylphenidate (FOCALIN) 5 MG tablet Take 5 mg by mouth daily with lunch. Only on school days    [provider]  GuanFACINE HCl (INTUNIV) 3 MG TB24 Take 1 tablet by mouth daily.    [provider]  methylphenidate (METADATE CD) 60 MG CR capsule Take 45 mg by mouth every morning.     [provider]    Physical Exam:  Constitutional: Lethargic, but easily arousable.  Patient in no acute distress at this time. Vitals:   10/05/17 2141 10/05/17 2153 10/05/17 2230 10/06/17 0010  BP:  119/75 117/64 117/70  Pulse:  82 91 74  Resp:  17  17  Temp:      TempSrc:      SpO2:  95% 95% 96%  Weight: 58.5 kg (129 lb)     Height: 5' 2"  (1.575 m)      Eyes: PERRL, lids and conjunctivae normal ENMT: Mucous membranes are dry posterior pharynx clear of any exudate or lesions. Normal dentition.  Neck: normal, supple, no masses, no thyromegaly Respiratory: clear to auscultation bilaterally, no wheezing, no crackles. Normal respiratory effort. No accessory muscle use.  Cardiovascular: Regular rate and rhythm, no murmurs / rubs / gallops. No extremity edema. 2+ pedal pulses. No carotid bruits.  Abdomen: Suprapubic tenderness present with right-sided flank pain. Musculoskeletal: no clubbing / cyanosis. No joint deformity upper and lower extremities. Good ROM, no contractures. Normal muscle tone.  Skin: no rashes, lesions, ulcers. No induration Neurologic: CN 2-12 grossly intact. Sensation intact, DTR normal. Strength 5/5 in all 4.  Psychiatric: Normal judgment and insight. Alert and oriented x 3.  Flat mood.     Labs on Admission: I have personally reviewed following labs and imaging studies  CBC: Recent Labs  Lab 10/05/17 2255  WBC 6.0  NEUTROABS 3.8  HGB 16.4  HCT 44.9  MCV 83.1  PLT 329   Basic Metabolic Panel: Recent Labs  Lab 10/05/17 2255  NA 139  K 3.9  CL 100*  CO2 28  GLUCOSE 582*  BUN 15  CREATININE 0.80  CALCIUM 9.6   GFR: Estimated Creatinine Clearance: 113.8 mL/min (by C-G formula based on SCr of 0.8 mg/dL). Liver Function Tests: Recent Labs  Lab 10/05/17 2255  AST 17  ALT 16*  ALKPHOS 209*  BILITOT 1.3*  PROT 7.4  ALBUMIN 4.4    Recent Labs  Lab 10/05/17 2255  LIPASE 33   No results for input(s): AMMONIA in the last 168 hours. Coagulation Profile: No results for input(s): INR, PROTIME in the last 168 hours. Cardiac Enzymes: No results for input(s): CKTOTAL, CKMB, CKMBINDEX, TROPONINI in the last 168 hours. BNP (last 3 results) No results for input(s): PROBNP in the last 8760 hours. HbA1C: No results for input(s): HGBA1C in the last 72 hours. CBG: Recent Labs  Lab 10/06/17 0123  GLUCAP 349*   Lipid Profile: No results for input(s): CHOL, HDL, LDLCALC, TRIG, CHOLHDL, LDLDIRECT in the last 72 hours. Thyroid Function Tests: No results for input(s): TSH, T4TOTAL, FREET4, T3FREE, THYROIDAB in the last 72 hours. Anemia Panel: No results for input(s): VITAMINB12, FOLATE, FERRITIN, TIBC, IRON, RETICCTPCT in the last 72 hours. Urine analysis:    Component Value Date/Time   COLORURINE YELLOW 10/05/2017 2228   APPEARANCEUR CLEAR 10/05/2017 2228   LABSPEC <1.005 (L) 10/05/2017 2228   PHURINE 6.0 10/05/2017 2228   GLUCOSEU >=500 (  A) 10/05/2017 2228   HGBUR NEGATIVE 10/05/2017 2228   BILIRUBINUR NEGATIVE 10/05/2017 2228   KETONESUR >80 (A) 10/05/2017 2228   PROTEINUR NEGATIVE 10/05/2017 2228   NITRITE NEGATIVE 10/05/2017 2228   LEUKOCYTESUR NEGATIVE 10/05/2017 2228   Sepsis Labs: No results found for this or any previous visit (from the past 240 hour(s)).   Radiological Exams on Admission: Ct Abdomen Pelvis W Contrast  Result Date: 10/06/2017 CLINICAL DATA:  Right-sided abdominal pain EXAM: CT ABDOMEN AND PELVIS WITH CONTRAST TECHNIQUE: Multidetector CT imaging of the abdomen and pelvis was performed using the standard protocol following bolus administration of intravenous contrast. CONTRAST:  18m ISOVUE-300 IOPAMIDOL (ISOVUE-300) INJECTION 61% COMPARISON:  None. FINDINGS: Lower chest: No acute abnormality. Hepatobiliary: No focal liver abnormality is seen. No gallstones, gallbladder wall thickening, or  biliary dilatation. Pancreas: Unremarkable. No pancreatic ductal dilatation or surrounding inflammatory changes. Spleen: Normal in size without focal abnormality. Adrenals/Urinary Tract: Adrenal glands are within normal limits. Enlarged renal pelvises and prominent ureters with markedly distended urinary bladder. Stomach/Bowel: Stomach is within normal limits. Appendix appears normal. No evidence of bowel wall thickening, distention, or inflammatory changes. Vascular/Lymphatic: No significant vascular findings are present. No enlarged abdominal or pelvic lymph nodes. Reproductive: Prostate is unremarkable. Other: Negative for free air or free fluid Musculoskeletal: No acute or significant osseous findings. IMPRESSION: 1. No CT evidence for acute intra-abdominal or pelvic abnormality. Negative for appendicitis. 2. Markedly enlarged urinary bladder. Dilated renal pelvises bilaterally with slightly enlarged ureters, possibly due to over distention of the bladder. Electronically Signed   By: KDonavan FoilM.D.   On: 10/06/2017 00:55    CT abdomen pelvis: Independently reviewed.  Showing markedly distended bladder.  Assessment/Plan New onset diabetes mellitus: Patient presents with elevated blood glucose of 582 on admission with CO2 28 and anion gap of 11.  UA positive for glucose and ketones, but negative for signs of infection.  He has a significant history of diabetes in his family, but also medication such as Abilify as possible cause.  - Admit to a MedSurg bed - Check hemoglobin A1c in a.m.  - Continue to monitor electrolytes and replace as needed - Antiemetics as needed for nausea vomiting - CBGs q. before meals with sensitive SSI - Diabetic education in a.m.   Right-sided abdominal pain, question neurogenic bladder: Patient seen to have markedly distended bladder on CT imaging, but no signs of infection on urinalysis.  Patient with previous history of what he reports as muscles not working leading to  constipation and dilated bowel for which he underwent anal rectal manometry testing in 01/2016. - Continue Foley catheter for now - Consider trial of bladder training and/or consult to urology in a.m.   Weight loss: Patient documented to weigh around 193 pounds back in February, but currently weight is 129 pounds.  Suspect weight loss could be attributed to uncontrolled diabetes. - Check TSH and ESR  Bipolar disorder, depression - Continue Abilify and Zoloft  [facility]eral bone cyst: [facility] - Continue outpatient follow-up at [facility]  ADHD - restart guanfacine once medically appropriate  DVT prophylaxis: Lovenox Code Status: Full Family Communication: No family present at bedside Disposition Plan: Likely discharge home in 1 to 2 days Consults called: None Admission status: Observation  RNorval MortonMD [facility] Pager [phone]  If 7PM-7AM, please contact night-coverage [email or web address] Password [email or web address]  10/06/2017, 2:01 AM

## 2017-10-06 NOTE — Progress Notes (Signed)
Patient ID: Danny Werner, male   DOB: Mar 30, 1996, 22 y.o.   MRN: [id number] Patient was admitted early this morning for right-sided abdominal pain and was found to be hyperglycemic with blood sugar of 582 and anion gap of 11 with acute urinary tension for which Foley catheter was placed.  Patient seen and examined at bedside this morning.  Plan of care discussed with him.  Follow diabetes coordinator recommendations.  Patient will need outpatient follow-up with primary care provider/endocrinology.  Consult care management.  Follow hemoglobin A1c.  Continue Accu-Cheks with coverage ;check a.m. labs.

## 2017-10-06 NOTE — Progress Notes (Addendum)
Inpatient Diabetes Program Recommendations  AACE/ADA: New Consensus Statement on Inpatient Glycemic Control (2015)  Target Ranges:  Prepandial:   less than 140 mg/dL      Peak postprandial:   less than 180 mg/dL (1-2 hours)      Critically ill patients:  140 - 180 mg/dL   Results for Danny, Werner (MRN [id number]) as of 10/06/2017 07:13  Ref. Range 10/05/2017 22:55 10/06/2017 05:12  Glucose Latest Ref Range: 65 - 99 mg/dL 582 (HH) 282 (H)  Results for [name], Werner (MRN [id number]) as of 10/06/2017 07:13  Ref. Range 10/06/2017 01:23  Glucose-Capillary Latest Ref Range: 65 - 99 mg/dL 349 (H)   Review of Glycemic Control  Diabetes history: NO Outpatient Diabetes medications: NA Current orders for Inpatient glycemic control:  Novolog 0-9 units TID with meals  Inpatient Diabetes Program Recommendations: Correction (SSI): Please consider ordering Novolog 0-5 units QHS. HgbA1C: A1C in process. Labs: Please consider ordering a C-Peptide and Beta Hydroxybutyrate.   NOTE: Noted consult for Diabetes Coordinator for new onset DM. Diabetes Coordinator is not on campus over the weekend but available by pager from 8am-5pm for questions. In reviewing the chart, noted patient has history of unicameral bone cyst followed at [facility] and gets intermittent steroid injections. In Care Everywhere, the last office visit note with [facility] was over 2 years ago on 06/16/2015. Not noted in chart when he last received steroid injection. It is noted patient has lost about 60 pounds over the last 3 weeks. Initial labs indicate patient was not acidotic (CO2 28 and anion gap 11) when he presented to the hospital but ketones and glucose noted in urine. Initial glucose 582 mg/dl and patient received Novolog 6 units at 1:45 am and glucose down to 282 mg/dl this morning with no other insulin given. A1C in process. Ordered: Living Well with DM book, RD consult, DM videos, and patient education by bedside RNs. Diabetes  Coordinator will follow up with patient on Monday morning (10/07/17).  Addendum 10/06/17_0 :20: Now ordered: Levemir 10 units QHS, Novolog 0-9 units TID with meals, Novolog 0-5 units QHS, Novolog 5 units TID with meals. Called patient over phone and spoke with patient's mother. Asked to speak with patient and mother stated "you can just talk to me". Explained that I would like to talk to patient as well but mother stated "he doesn't comprehend things well and you will have to talk to me so I can explain things to him."  Patient's mother reports that patient lives with his grandmother and step grandfather. She reports she has a distant cousin with Type 1 DM and she thinks the patient's fathers mother had Type 1 DM as well but she is not certain. Inquired any recent medication changes and she reports that there have not been any recent medication changes in over the past few months and she reports that patient has not had any steroid injections in over 2 years. Discussed DM and importance of glycemic control. Discussed A1C (13.6) and explained that A1C indicates an average glucose of 344 mg/dl over the past 2-3 months. Reviewed glucose and A1C goals and explained that patient is currently ordered Levemir and Novolog insulin. Informed patient's mother that the Living Well with DM book had been ordered and that the insulin starter kit would be ordered as well. Asked that patient and his family read both to gain a better understanding of DM, importance of glycemic control, and insulin. Informed patient's mother that Diabetes Coordinator will plan to  see patient tomorrow and she asked to be present when patient seen by Diabetes Coordinator tomorrow. She requested that Diabetes Coordinator plan to come see patient around 11:00 am on Monday. Patient's mother verbalized understanding of information discussed and states that she has no further questions at this time. Called and talked with Danny Bloodgood, RN to inform her of  conversation with patient's mother and asked that nursing use each patient interaction to educate patient on DM and insulin. Also asked that patient be allowed to self administer insulin injections. Will plan to have Diabetes Coordinator see patient tomorrow at 11:00 am.  Thanks, Danny Alderman, RN, MSN, CDE Diabetes Coordinator Inpatient Diabetes Program [phone] (Team Pager from 8am to 5pm)

## 2017-10-06 NOTE — ED Notes (Signed)
Urine culture sent with initial sample.

## 2017-10-06 NOTE — ED Notes (Addendum)
Date and time results received: 10/06/17 1212  Test: Glucose Critical Value: 582  Name of Provider Notified: Lahoma Rocker PA  Orders Received? Or Actions Taken?: No actions at this time

## 2017-10-06 NOTE — Progress Notes (Signed)
Dr. Starla Link aware pt c/o discomfort at foley cath site this shift. See new order to dc foley.

## 2017-10-06 NOTE — Progress Notes (Signed)
[employer] Diabetes teaching kit at bedside. Reviewed briefly. Will begin education at time of insulin administration.

## 2017-10-07 DIAGNOSIS — M8540 Solitary bone cyst, unspecified site: Secondary | ICD-10-CM

## 2017-10-07 DIAGNOSIS — F909 Attention-deficit hyperactivity disorder, unspecified type: Secondary | ICD-10-CM | POA: Diagnosis not present

## 2017-10-07 DIAGNOSIS — R338 Other retention of urine: Secondary | ICD-10-CM | POA: Diagnosis not present

## 2017-10-07 DIAGNOSIS — E119 Type 2 diabetes mellitus without complications: Secondary | ICD-10-CM | POA: Diagnosis not present

## 2017-10-07 LAB — GLUCOSE, CAPILLARY
GLUCOSE-CAPILLARY: 285 mg/dL — AB (ref 65–99)
Glucose-Capillary: 205 mg/dL — ABNORMAL HIGH (ref 65–99)
Glucose-Capillary: 209 mg/dL — ABNORMAL HIGH (ref 65–99)

## 2017-10-07 LAB — URINE CULTURE: Culture: NO GROWTH

## 2017-10-07 MED ORDER — INSULIN DETEMIR 100 UNIT/ML FLEXPEN: 12.0000 [IU] | PEN_INJECTOR | Freq: Every day | SUBCUTANEOUS | 0 refills | Status: DC

## 2017-10-07 MED ORDER — INSULIN DETEMIR 100 UNIT/ML ~~LOC~~ SOLN: 10.0000 [IU] | Freq: Every day | SUBCUTANEOUS | 0 refills | Status: DC

## 2017-10-07 MED ORDER — GLUCERNA SHAKE PO LIQD
237.0000 mL | Freq: Two times a day (BID) | ORAL | Status: DC | PRN
  Filled 2017-10-07: qty 237

## 2017-10-07 MED ORDER — INSULIN ASPART 100 UNIT/ML FLEXPEN: 6.0000 [IU] | PEN_INJECTOR | Freq: Three times a day (TID) | SUBCUTANEOUS | 0 refills | Status: DC

## 2017-10-07 MED ORDER — GLUCERNA SHAKE PO LIQD: 237.0000 mL | Freq: Two times a day (BID) | ORAL | 2 refills | Status: DC | PRN

## 2017-10-07 MED ORDER — ONDANSETRON HCL 4 MG PO TABS: 4.0000 mg | ORAL_TABLET | Freq: Four times a day (QID) | ORAL | 0 refills | Status: DC | PRN

## 2017-10-07 MED ORDER — INSULIN ASPART 100 UNIT/ML ~~LOC~~ SOLN: 5.0000 [IU] | Freq: Three times a day (TID) | SUBCUTANEOUS | 11 refills | Status: DC

## 2017-10-07 MED ORDER — BLOOD GLUCOSE MONITOR KIT: PACK | 0 refills | Status: DC

## 2017-10-07 MED ORDER — INSULIN PEN NEEDLE 31G X 5 MM MISC: 0 refills | Status: DC

## 2017-10-07 NOTE — Plan of Care (Signed)
  Problem: Education: Goal: Ability to describe self-care measures that may prevent or decrease complications (Diabetes Survival Skills Education) will improve Outcome: Progressing   Problem: Coping: Goal: Ability to adjust to condition or change in health will improve Outcome: Progressing   Problem: Coping: Goal: Level of anxiety will decrease Outcome: Progressing   Problem: Pain Managment: Goal: General experience of comfort will improve Outcome: Progressing

## 2017-10-07 NOTE — Care Management Note (Signed)
Case Management Note  Patient Details  Name: Danny Werner MRN: [id number] Date of Birth: 1995-06-27  Subjective/Objective:Patient has medicaid-has an established pcp, a case worker,& a pharmacy for meds. Received call from [place] about Patient's mom @ [facility] & unable to get the meds-levimir,& novolog insulin-Contacted the Medicaid [employer] who called the mother & found out what the issue was-Prior auth from attending is needed-Charge Nurse informed & will contact attending to inform of wha'ts needed. Informed nurse/Tawain to contact CM if further assistance is needed.                 Action/Plan:d/c home.   Expected Discharge Date:  10/07/17               Expected Discharge Plan:  Home/Self Care  In-House Referral:     Discharge planning Services  CM Consult, Medication Assistance  Post Acute Care Choice:    Choice offered to:     DME Arranged:    DME Agency:     HH Arranged:    HH Agency:     Status of Service:  Completed, signed off  If discussed at [employer] of Stay Meetings, dates discussed:    Additional Comments:  Dessa Phi, RN 10/07/2017, 2:37 PM

## 2017-10-07 NOTE — Progress Notes (Signed)
Patient and family given discharge instructions along with prescriptions. No questions or concerns at this time.

## 2017-10-07 NOTE — Progress Notes (Signed)
Called [facility] pharmacist-they have received approval for the levimir,& novolog.Dr. Alekh/Amy(charge nurse) updated, no further CM needs.

## 2017-10-07 NOTE — Discharge Summary (Signed)
Physician Discharge Summary  ALDEAN PIPE GGE:[id number] DOB: 1996/01/24 DOA: 10/05/2017  PCP: Beverly Sessions  Admit date: 10/05/2017 Discharge date: 10/07/2017  Admitted From: Home Disposition:  Home  Recommendations for Outpatient Follow-up:  1. Follow up with PCP in 1 week 2. 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  He was found to have glucose of 582.  CT scan of the abdomen and pelvis revealed markedly enlarged urinary bladder with dilated renal pelvis with slightly enlarged ureters.  Patient had a Foley's catheter placed initially.  Foley catheter was subsequently removed and he has no complaints with urination.  Hemoglobin A1c was 13.6.  He was started on Levemir and short-acting insulin with meals.  His blood sugars have improved.  He will be discharged home with outpatient follow-up with PCP and/or endocrinology.  Discharge Diagnoses:  Principal Problem:   Diabetes mellitus, new onset ([place]) Active Problems:   Unicameral bone cyst   Abdominal pain   Acute urinary retention   ADHD   Weight loss  New onset diabetes mellitus, uncontrolled with hyperglycemia -I blood sugar was 582 on admission.  Hemoglobin A1c 13.6.  He was started on Levemir along with short acting insulin with meals.  Diabetes coordinator evaluation and follow-up appreciated.  Blood sugars improved.  Discharged home on Levemir 12 units at bedtime and NovoLog 6 units 3 times a day with meals.  Continue blood sugar monitoring and keep a log of readings and follow-up with primary care provider at earliest convenience. -Patient might need outpatient endocrinology  evaluation  Urinary retention on admission causing right-sided abdominal pain -Status post Foley catheter placement on admission.  Foley catheter has been removed, patient is urinating without any complaints.  Outpatient follow-up with urology if needed.  CT scan of the abdomen and pelvis showed markedly enlarged urinary bladder with dilated renal pelvis with slightly enlarged ureters on admission  Moderate malnutrition -Follow nutrition recommendations  Bipolar disorder/depression -Continue Abilify and Zoloft  Unicameral bone cyst -Continue outpatient follow-up at [facility]  ADHD -Restart guanfacine  Discharge Instructions  Discharge Instructions    Call MD for:  extreme fatigue   Complete by:  As directed    Call MD for:  persistant dizziness or light-headedness   Complete by:  As directed    Call MD for:  persistant nausea and vomiting   Complete by:  As directed    Diet Carb Modified   Complete by:  As directed    Increase activity slowly   Complete by:  As directed      Allergies as of 10/07/2017      Reactions   Codeine Other (See Comments)   seizures   Penicillins Other (See Comments)   Seizures Has patient had a PCN reaction causing immediate rash, facial/tongue/throat swelling, SOB or lightheadedness with hypotension: No Has patient had a PCN reaction causing severe rash involving mucus membranes or skin necrosis: No Has patient had a PCN reaction that required hospitalization: Yes Has patient had a PCN reaction occurring within the last 10 years: No If all of the above answers are "NO", then may proceed with Cephalosporin use.   Promethazine Hcl Other (See Comments)   Seizures.      Medication List    STOP taking  these medications   butalbital-acetaminophen-caffeine 50-325-40 MG tablet Commonly known as:  FIORICET, ESGIC     TAKE these medications   acetaminophen 500 MG tablet Commonly known as:  TYLENOL Take 500 mg by mouth every 6 (six) hours as needed  for headache.   ARIPiprazole 5 MG tablet Commonly known as:  ABILIFY Take 5 mg by mouth daily.   blood glucose meter kit and supplies Kit Dispense based on patient and insurance preference. Use up to four times daily as directed. (FOR ICD-9 250.00, 250.01).   feeding supplement (GLUCERNA SHAKE) Liqd Take 237 mLs by mouth 2 (two) times daily between meals as needed (per pt preference).   insulin aspart 100 UNIT/ML FlexPen Commonly known as:  NOVOLOG Inject 6 Units into the skin 3 (three) times daily with meals.   Insulin Detemir 100 UNIT/ML Pen Commonly known as:  LEVEMIR Inject 12 Units into the skin daily at 10 pm.   Insulin Pen Needle 31G X 5 MM Misc levemir 10 units SQ at bedtime, Novolog 5 units 3 times a day with meals SQ   INTUNIV 2 MG Tb24 ER tablet Generic drug:  guanFACINE Take 2 mg by mouth daily. Pt takes a 7m tablet and 468mtablet together for a total dose of 7m49m  guanFACINE 4 MG Tb24 ER tablet Commonly known as:  INTUNIV Take 4 mg by mouth daily. Pt takes a 2mg77mblet and 4mg 33mlet together for a total dose of 7mg. 68mndansetron 4 MG tablet Commonly known as:  ZOFRAN Take 1 tablet (4 mg total) by mouth every 6 (six) hours as needed for nausea.   sertraline 50 MG tablet Commonly known as:  ZOLOFT Take 50 mg by mouth daily.      Follow-up Information    [facility]. Schedule an appointment as soon as possible for a visit in 1 week(s).   Contact information: [address] [phone]    Allergies  Allergen Reactions  . Codeine Other (See Comments)    seizures  . Penicillins Other (See Comments)    Seizures  Has patient had a PCN reaction causing immediate rash, facial/tongue/throat swelling, SOB or lightheadedness with hypotension: No Has patient had a PCN reaction causing severe rash involving mucus membranes or skin necrosis: No Has patient had a PCN reaction that required hospitalization: Yes Has patient had a  PCN reaction occurring within the last 10 years: No If all of the above answers are "NO", then may proceed with Cephalosporin use.   . Promethazine Hcl Other (See Comments)    Seizures.    Consultations:  None   Procedures/Studies: Ct Abdomen Pelvis W Contrast  Result Date: 10/06/2017 CLINICAL DATA:  Right-sided abdominal pain EXAM: CT ABDOMEN AND PELVIS WITH CONTRAST TECHNIQUE: Multidetector CT imaging of the abdomen and pelvis was performed using the standard protocol following bolus administration of intravenous contrast. CONTRAST:  100mL [id number] IOPAMIDOL (ISOVUE-300) INJECTION 61% COMPARISON:  None. FINDINGS: Lower chest: No acute abnormality. Hepatobiliary: No focal liver abnormality is seen. No gallstones, gallbladder wall thickening, or biliary dilatation. Pancreas: Unremarkable. No pancreatic ductal dilatation or surrounding inflammatory changes. Spleen: Normal in size without focal abnormality. Adrenals/Urinary Tract: Adrenal glands are within normal limits. Enlarged renal pelvises and prominent ureters with markedly distended urinary bladder. Stomach/Bowel: Stomach is within normal limits. Appendix appears normal. No evidence of bowel wall thickening, distention, or inflammatory changes. Vascular/Lymphatic: No significant vascular findings are present. No enlarged abdominal or  pelvic lymph nodes. Reproductive: Prostate is unremarkable. Other: Negative for free air or free fluid Musculoskeletal: No acute or significant osseous findings. IMPRESSION: 1. No CT evidence for acute intra-abdominal or pelvic abnormality. Negative for appendicitis. 2. Markedly enlarged urinary bladder. Dilated renal pelvises bilaterally with slightly enlarged ureters, possibly due to over distention of the bladder. Electronically Signed   By: Donavan Foil M.D.   On: 10/06/2017 00:55      Subjective: Patient seen and examined at bedside.  He feels better.  Denies any overnight fever, nausea, vomiting.   Tolerating diet.  Discharge Exam: Vitals:   10/06/17 2200 10/07/17 0440  BP: 123/70 111/63  Pulse: (!) 57 (!) 52  Resp: 14 15  Temp: 98.1 F (36.7 C) 97.8 F (36.6 C)  SpO2: 100% 100%   Vitals:   10/06/17 0346 10/06/17 1515 10/06/17 2200 10/07/17 0440  BP: 112/61 114/68 123/70 111/63  Pulse: 82 (!) 57 (!) 57 (!) 52  Resp: _0 Temp: 98.2 F (36.8 C) 98.2 F (36.8 C) 98.1 F (36.7 C) 97.8 F (36.6 C)  TempSrc: Oral Oral Oral Oral  SpO2: 95% 99% 100% 100%  Weight:      Height:        General: Pt is alert, awake, not in acute distress Cardiovascular: Slightly bradycardic, S1/S2 + Respiratory: Bilateral decreased breath sounds at bases Abdominal: Soft, NT, ND, bowel sounds + Extremities: no edema, no cyanosis    The results of significant diagnostics from this hospitalization (including imaging, microbiology, ancillary and laboratory) are listed below for reference.     Microbiology: Recent Results (from the past 240 hour(s))  Urine culture     Status: None   Collection Time: 10/06/17  1:52 AM  Result Value Ref Range Status   Specimen Description   Final    URINE, RANDOM Performed at [facility] [address], [place], [place] [address]    Special Requests   Final    NONE Performed at [facility], [place] [address], [place], [place] [address]    Culture   Final    NO GROWTH Performed at [facility], [place] [address] [place], [place] [address]    Report Status 10/07/2017 FINAL  Final     Labs: BNP (last 3 results) No results for input(s): BNP in the last 8760 hours. Basic Metabolic Panel: Recent Labs  Lab 10/05/17 2255 10/06/17 0512  NA 139 145  K 3.9 3.7  CL 100* 110  CO2 28 26  GLUCOSE 582* 282*  BUN 15 13  CREATININE 0.80 0.66  CALCIUM 9.6 8.8*   Liver Function Tests: Recent Labs  Lab 10/05/17 2255 10/06/17 0512  AST 17 15  ALT 16* 13*  ALKPHOS 209* 144*  BILITOT 1.3* 0.8  PROT 7.4  6.1*  ALBUMIN 4.4 3.7   Recent Labs  Lab 10/05/17 2255  LIPASE 33   No results for input(s): AMMONIA in the last 168 hours. CBC: Recent Labs  Lab 10/05/17 2255 10/06/17 0512  WBC 6.0 6.7  NEUTROABS 3.8  --   HGB 16.4 14.2  HCT 44.9 40.4  MCV 83.1 85.4  PLT 305 269   Cardiac Enzymes: No results for input(s): CKTOTAL, CKMB, CKMBINDEX, TROPONINI in the last 168 hours. BNP: Invalid input(s): POCBNP CBG: Recent Labs  Lab 10/06/17 2159 10/06/17 2357 10/07/17 0438 10/07/17 0734 10/07/17 1208  GLUCAP 103* 224* 205* 209* 285*   D-Dimer No results for input(s): DDIMER in the  last 72 hours. Hgb A1c Recent Labs    10/06/17 0512  HGBA1C 13.6*   Lipid Profile No results for input(s): CHOL, HDL, LDLCALC, TRIG, CHOLHDL, LDLDIRECT in the last 72 hours. Thyroid function studies Recent Labs    10/06/17 0512  TSH 2.070   Anemia work up No results for input(s): VITAMINB12, FOLATE, FERRITIN, TIBC, IRON, RETICCTPCT in the last 72 hours. Urinalysis    Component Value Date/Time   COLORURINE YELLOW 10/05/2017 2228   APPEARANCEUR CLEAR 10/05/2017 2228   LABSPEC <1.005 (L) 10/05/2017 2228   PHURINE 6.0 10/05/2017 2228   GLUCOSEU >=500 (A) 10/05/2017 2228   HGBUR NEGATIVE 10/05/2017 2228   BILIRUBINUR NEGATIVE 10/05/2017 2228   KETONESUR >80 (A) 10/05/2017 2228   PROTEINUR NEGATIVE 10/05/2017 2228   NITRITE NEGATIVE 10/05/2017 2228   LEUKOCYTESUR NEGATIVE 10/05/2017 2228   Sepsis Labs Invalid input(s): PROCALCITONIN,  WBC,  LACTICIDVEN Microbiology Recent Results (from the past 240 hour(s))  Urine culture     Status: None   Collection Time: 10/06/17  1:52 AM  Result Value Ref Range Status   Specimen Description   Final    URINE, RANDOM Performed at [facility], [place] [address], [place], [place] [address]    Special Requests   Final    NONE Performed at [facility], [place] [address] [place], [place] [address]    Culture    Final    NO GROWTH Performed at [facility], [place] [address] [place], [place] [address]    Report Status 10/07/2017 FINAL  Final     Time coordinating discharge: 35 minutes  SIGNED:   Aline August, MD  [facility] 10/07/2017, 12:35 PM Pager: [phone]  If 7PM-7AM, please contact night-coverage [email or web address] Password [email or web address]

## 2017-10-07 NOTE — Progress Notes (Signed)
Initial Nutrition Assessment  DOCUMENTATION CODES:  Non-severe (moderate) malnutrition in context of chronic illness  INTERVENTION:  - Provided diabetes education and "Carbohydrate Counting for People with Diabetes Handout" from the Academy of Nutrition and Dietetics  - Glucerna Shake po BID, each supplement provides 220 kcal and 10 grams of protein  - Encourage PO intake  NUTRITION DIAGNOSIS:  Moderate Malnutrition related to chronic illness(new onset diabetes mellitus) as evidenced by mild fat depletion, mild muscle depletion.  GOAL:   Patient will meet greater than or equal to 90% of their needs  MONITOR:  PO intake, Weight trends, Labs, I & O's  REASON FOR ASSESSMENT:  Malnutrition Screening Tool, Consult Diet education  ASSESSMENT:  22 year old male who presented to the ED with flank pain, abdominal pain, and hyperglycemia. PMH significant for ADHD, bipolar disorder, migraines, and left humeral unicameral bone cyst followed at [facility].  Spoke with RN prior to meeting with pt.  Obtained diet and weight history from pt, mother, and grandmother at bedside. Pt somewhat distracted with flat affect.  Pt reports that prior to the last 3 months, he ate "all the time." Pt endorses eating 3 meals and snacks during that time. Pt states that he began losing weight and having a decreased appetite 3 months ago. Pt endorses polydipsia, polyuria, nausea/vomiting, and low energy during that time.  Pt states that he ate less than usual over the past 3 months and consumed 1-2 meals. Pt states he "cut back" because he wasn't hungry. A meal might include a burger, wings, cereal with milk, or a ham and vegetable omelet. Pt enjoys snacking on vegetable straws and drinking water, [employer], and Monster energy drinks.  Pt with weight of 193 lbs recorded on 05/19/17 and 129 lbs recorded on 10/05/17. Weights appear to be stated rather than measured so unsure of accuracy. If accurate, this is a 64  lb weight loss in 4.5 months (33.2% weight loss, severe and significant for timeframe). Pt and pt's family members endorse extensive weight loss, stating pt has lost 55 lbs in the past 3 months.  Meal Completion: 50%  Medications reviewed and include: sliding scale Novolog, 5 units Novolog TID, 10 units Levemir daily  Labs reviewed: hemoglobin A1C 13.6% (H) CBG's: 209, 205, 224, 103, 147, 254 x 24 hours  UOP: 1475 ml x 24 hours  RD consulted for nutrition education regarding diabetes. RD provided "Carbohydrate Counting for People with Diabetes" handout from the Academy of Nutrition and Dietetics. Spoke at length with pt, mother, and grandmother at bedside.  Discussed different food groups and their effects on blood sugar, emphasizing carbohydrate-containing foods. Provided list of carbohydrates and recommended serving sizes of common foods. Discussed carbohydrate content of several of pt's commonly consumed beverages. Provided recommendations for carbohydrate intake based on pt's age, weight, and lifestyle goals.  Discussed importance of controlled and consistent carbohydrate intake throughout the day. Provided examples of ways to balance meals/snacks and encouraged intake of high-fiber, whole grain complex carbohydrates. Practiced meal planning with pt and counting carbohydrate grams for several commonly consumed foods.  Also discussed nutrition facts label and where to locate serving size and total carbohydrates. Used nutrition facts label from beverage at pt's bedside as an example.  Teach back method used.  Expect fair compliance.  NUTRITION - FOCUSED PHYSICAL EXAM:    Most Recent Value  Orbital Region  Mild depletion  Upper Arm Region  Mild depletion  Thoracic and Lumbar Region  Mild depletion  Buccal Region  No depletion  Temple Region  Mild depletion  Clavicle Bone Region  Mild depletion  Clavicle and Acromion Bone Region  Mild depletion  Scapular Bone Region  Unable to assess   Dorsal Hand  No depletion  Patellar Region  Mild depletion  Anterior Thigh Region  Mild depletion  Posterior Calf Region  Mild depletion  Edema (RD Assessment)  None  Hair  Reviewed  Eyes  Reviewed  Mouth  Reviewed  Skin  Reviewed  Nails  Reviewed       Diet Order:   Diet Order           Diet Carb Modified        Diet Carb Modified Fluid consistency: Thin; Room service appropriate? Yes  Diet effective now          EDUCATION NEEDS:  Education needs have been addressed  Skin:  Skin Assessment: Reviewed RN Assessment  Last BM:  10/05/17  Height:   Ht Readings from Last 1 Encounters:  10/05/17 5\' 2"  (1.575 m)    Weight:   Wt Readings from Last 1 Encounters:  10/05/17 129 lb (58.5 kg)    Ideal Body Weight:  53.6 kg  BMI:  Body mass index is 23.59 kg/m.  Estimated Nutritional Needs:   Kcal:  2200-2400 kcal/day  Protein:  115-130 grams/day  Fluid:  2.0-2.4 L/day    Gaynell Face, MS, RD, LDN Pager: [phone] Weekend/After Hours: [phone]

## 2017-10-07 NOTE — Progress Notes (Signed)
Inpatient Diabetes Program Recommendations  AACE/ADA: New Consensus Statement on Inpatient Glycemic Control (2015)  Target Ranges:  Prepandial:   less than 140 mg/dL      Peak postprandial:   less than 180 mg/dL (1-2 hours)      Critically ill patients:  140 - 180 mg/dL   Lab Results  Component Value Date   GLUCAP 209 (H) 10/07/2017   HGBA1C 13.6 (H) 10/06/2017    Review of Glycemic Control  HgbA1C - 13.6% - New diagnosis Blood sugar - 582 mg/dL on admission To meet with mother this am.  Needs insulin adjustment. Will need Endo appt prior to discharge. Pt should have no problems obtaining his insulin as Medicaid is insurance. Started Novolog 5 units tidwc and 0-9 units tidwc and hs   Inpatient Diabetes Program Recommendations:  Increase Levemir to 12 units QHS  Waiting to speak with pt's mother.  Thank you. Lorenda Peck, RD, LDN, CDE Inpatient Diabetes Coordinator [phone]

## 2017-10-07 NOTE — Progress Notes (Signed)
Inpatient Diabetes Program Recommendations  AACE/ADA: New Consensus Statement on Inpatient Glycemic Control (2015)  Target Ranges:  Prepandial:   less than 140 mg/dL      Peak postprandial:   less than 180 mg/dL (1-2 hours)      Critically ill patients:  140 - 180 mg/dL   Lab Results  Component Value Date   GLUCAP 209 (H) 10/07/2017   HGBA1C 13.6 (H) 10/06/2017    Review of Glycemic Control  HgbA1C - 13.6%. Discussed results with pt, mother and GM. Goal is 7%. Mother has scheduled appt for 6/24 to see PCP. Instructed pt, mother and GM on insulin pen use. Mother and GM were able to give return demonstration. Discussed hypo s/s and treatment and hyperglycemia. Stressed importance of rotating sites for insulin injections. Instructed to monitor blood sugars 4x/day and take logbook to MD appt.  Discussed how diet, exercise and stress affect blood sugars. Talked about importance of keeping blood sugars controlled to reduce risk of long and short-term complications.  Discussed above with RN.  Discussed above with RN and will page MD. Will need prescription for:  Lantus Solostar Pen Novolog Flexpen Pen Needles Glucose meter kit and supplies Recommend c-peptide at MD office  Thank you. Lorenda Peck, RD, LDN, CDE Inpatient Diabetes Coordinator [phone]

## 2017-10-22 ENCOUNTER — Encounter: Payer: Self-pay | Admitting: Endocrinology

## 2017-11-26 ENCOUNTER — Encounter: Payer: Medicaid Other | Attending: Endocrinology | Admitting: Dietician

## 2017-11-26 DIAGNOSIS — Z713 Dietary counseling and surveillance: Secondary | ICD-10-CM | POA: Insufficient documentation

## 2017-11-26 DIAGNOSIS — E119 Type 2 diabetes mellitus without complications: Secondary | ICD-10-CM

## 2017-11-27 ENCOUNTER — Encounter: Payer: Self-pay | Admitting: Dietician

## 2017-11-27 NOTE — Progress Notes (Signed)
Patient and grandmother seen prior to class.  Patient stated that weight was more than 200 lbs in January 2019.  He had lost to mid 120's and 148 lbs today.  Concerns about understanding in class and possible type 1 Diabetes.   After class patient's grandmother stated that she had to work during the next 2 class periods.  They arranged a one on one visit with another provider due to my schedule being full. Patient was seen on 11/26/17 for the first of a series of three diabetes self-management courses at [facility].  Patient Education Plan per assessed needs and concerns is to attend three course education program for Diabetes Self Management Education.  The following learning objectives were met by the patient during this class:  Describe diabetes  State some common risk factors for diabetes  Defines the role of glucose and insulin  Identifies type of diabetes and pathophysiology  Describe the relationship between diabetes and cardiovascular risk  State the members of the Healthcare Team  States the rationale for glucose monitoring  State when to test glucose  State their individual Target Range  State the importance of logging glucose readings  Describe how to interpret glucose readings  Identifies A1C target  Explain the correlation between A1c and eAG values  State symptoms and treatment of high blood glucose  State symptoms and treatment of low blood glucose  Explain proper technique for glucose testing  Identifies proper sharps disposal  Handouts given during class include:  ADA Diabetes You Take Control   Carb Counting and Meal Planning book  Meal Plan Card  Meal planning worksheet  Low Sodium Flavoring Tips  Types of Fats  The diabetes portion plate  F6C to eAG Conversion Chart  Diabetes Recommended Care Schedule  Support Group  Diabetes Success Plan  Core Class Satisfaction Survey   Follow-Up  Plan:  Attend core 2

## 2017-12-03 ENCOUNTER — Ambulatory Visit: Payer: Medicaid Other

## 2017-12-10 ENCOUNTER — Ambulatory Visit: Payer: Medicaid Other

## 2017-12-17 ENCOUNTER — Encounter: Payer: Medicaid Other | Attending: Endocrinology | Admitting: *Deleted

## 2017-12-17 DIAGNOSIS — E119 Type 2 diabetes mellitus without complications: Secondary | ICD-10-CM | POA: Insufficient documentation

## 2017-12-17 DIAGNOSIS — Z713 Dietary counseling and surveillance: Secondary | ICD-10-CM | POA: Diagnosis present

## 2017-12-17 NOTE — Patient Instructions (Signed)
Plan:  Aim for 3 Carb Choices per meal (45 grams) +/- 1 either way  Aim for 0-2 Carbs per snack if hungry  Include protein in moderation with your meals and snacks Consider reading food labels for Total Carbohydrate of foods Consider using Calorie [employer] APP for restaurants Continue with your activity level by playing basketball or swimming daily as tolerated Continue checking BG at alternate times per day  Continue taking medication as directed by MD

## 2017-12-19 NOTE — Progress Notes (Signed)
Diabetes Self-Management Education  Visit Type:  Follow-up  Appt. Start Time: 1400 Appt. End Time: 1500  12/19/2017  Danny Werner, identified by name and date of birth, is a 22 y.o. male with a diagnosis of Diabetes: (unknown, may have Type 1 but not diagnosed yet).  Patient attended the 1st of 3 Core Classes for Diabetes but was determined to not be appropriate due to illiteracy. He is here for individual follow up education, by himself. He expresses very good understanding of information taught in the 1st class. He has modified his food intake by decreasing carb servings of foods and eliminating any beverage that has sugar in it. He is physically active and is monitoring his BG appropriately.   ASSESSMENT  There were no vitals taken for this visit. There is no height or weight on file to calculate BMI.   Diabetes Self-Management Education - 12/19/17 0858      Psychosocial Assessment   Patient Belief/Attitude about Diabetes  Motivated to manage diabetes    Self-care barriers  Low literacy    Self-management support  Doctor's office;Family    Patient Concerns  Nutrition/Meal planning;Medication;Glycemic Control    Special Needs  None   Patient expressed good verbal understanding of info already taught as well as what we covered today   Learning Readiness  Change in progress      Complications   How often do you check your blood sugar?  > 4 times/day    Fasting Blood glucose range (mg/dL)  70-129    Postprandial Blood glucose range (mg/dL)  70-129;130-179    Number of hypoglycemic episodes per month  0      Dietary Intake   Breakfast  protein pack with egg, ham, sausage and cheese with water or milk    Lunch  1 sandwich and celery    Dinner  often in restaurant; steak or chicken with mashed potatoes and occasionally a vegetable    Snack (evening)  protein bar with 30 g protein and 3 g. sugar    Beverage(s)  water, milk, diet soda      Exercise   Exercise Type  Light (walking  / raking leaves)   basketball with friends or swimming every day   How many days per week to you exercise?  5    How many minutes per day do you exercise?  60    Total minutes per week of exercise  300      Patient Education   Disease state   Definition of diabetes, type 1 and 2, and the diagnosis of diabetes;Factors that contribute to the development of diabetes    Nutrition management   Role of diet in the treatment of diabetes and the relationship between the three main macronutrients and blood glucose level;Food label reading, portion sizes and measuring food.;Carbohydrate counting;Reviewed blood glucose goals for pre and post meals and how to evaluate the patients' food intake on their blood glucose level.    Physical activity and exercise   Role of exercise on diabetes management, blood pressure control and cardiac health.    Medications  Reviewed patients medication for diabetes, action, purpose, timing of dose and side effects.;Other (comment)   rotation of insulin injections   Monitoring  Identified appropriate SMBG and/or A1C goals.    Acute complications  Taught treatment of hypoglycemia - the 15 rule.    Psychosocial adjustment  Helped patient identify a support system for diabetes management   informed of DM 1 / Pump Support  Group     Individualized Goals (developed by patient)   Nutrition  Follow meal plan discussed    Physical Activity  Exercise 3-5 times per week    Medications  take my medication as prescribed    Monitoring   test blood glucose pre and post meals as discussed      Patient Self-Evaluation of Goals - Patient rates self as meeting previously set goals (% of time)   Nutrition  >75%    Physical Activity  >75%    Medications  >75%    Monitoring  >75%    Problem Solving  >75%    Reducing Risk  >75%    Health Coping  >75%      Post-Education Assessment   Patient understands the diabetes disease and treatment process.  Demonstrates understanding / competency     Patient understands incorporating nutritional management into lifestyle.  Demonstrates understanding / competency    Patient undertands incorporating physical activity into lifestyle.  Demonstrates understanding / competency    Patient understands using medications safely.  Demonstrates understanding / competency    Patient understands monitoring blood glucose, interpreting and using results  Demonstrates understanding / competency    Patient understands prevention, detection, and treatment of acute complications.  Demonstrates understanding / competency    Patient understands prevention, detection, and treatment of chronic complications.  Demonstrates understanding / competency    Patient understands how to develop strategies to address psychosocial issues.  Demonstrates understanding / competency    Patient understands how to develop strategies to promote health/change behavior.  Demonstrates understanding / competency      Outcomes   Program Status  Completed      Subsequent Visit   Since your last visit have you continued or begun to take your medications as prescribed?  Yes    Since your last visit have you experienced any weight changes?  No change    Since your last visit, are you checking your blood glucose at least once a day?  Yes       Learning Objective:  Patient will have a greater understanding of diabetes self-management. Patient education plan is to attend individual and/or group sessions per assessed needs and concerns.  Plan:   Patient Instructions  Plan:  Aim for 3 Carb Choices per meal (45 grams) +/- 1 either way  Aim for 0-2 Carbs per snack if hungry  Include protein in moderation with your meals and snacks Consider reading food labels for Total Carbohydrate of foods Consider using Calorie [employer] APP for restaurants Continue with your activity level by playing basketball or swimming daily as tolerated Continue checking BG at alternate times per day  Continue  taking medication as directed by MD  Expected Outcomes:  Demonstrated interest in learning. Expect positive outcomes  Education material provided: Food label handouts and Carbohydrate counting sheet, [facility]enor [facility] brochure  If problems or questions, patient to contact team via:  Phone  Future DSME appointment: - PRN

## 2017-12-23 ENCOUNTER — Ambulatory Visit: Payer: Medicaid Other | Admitting: Dietician

## 2017-12-24 ENCOUNTER — Ambulatory Visit (INDEPENDENT_AMBULATORY_CARE_PROVIDER_SITE_OTHER): Payer: Medicaid Other | Admitting: Endocrinology

## 2017-12-24 ENCOUNTER — Encounter: Payer: Self-pay | Admitting: Endocrinology

## 2017-12-24 ENCOUNTER — Encounter

## 2017-12-24 VITALS — BP 94/52 | HR 65 | Ht 63.0 in | Wt 162.6 lb

## 2017-12-24 DIAGNOSIS — E119 Type 2 diabetes mellitus without complications: Secondary | ICD-10-CM | POA: Diagnosis not present

## 2017-12-24 DIAGNOSIS — F6381 Intermittent explosive disorder: Secondary | ICD-10-CM | POA: Insufficient documentation

## 2017-12-24 LAB — POCT GLYCOSYLATED HEMOGLOBIN (HGB A1C): HEMOGLOBIN A1C: 7.9 % — AB (ref 4.0–5.6)

## 2017-12-24 MED ORDER — INSULIN DETEMIR 100 UNIT/ML FLEXPEN: 40.0000 [IU] | PEN_INJECTOR | SUBCUTANEOUS | 0 refills | Status: DC

## 2017-12-24 NOTE — Progress Notes (Signed)
Subjective:    Patient ID: Danny Werner, male    DOB: [birth year], 22 y.o.   MRN: [id number]  HPI pt is referred by [facility], for diabetes.  Pt states DM was dx'ed in [place], when he presented with nonketotic hyperosmolar hyperglycemic state, but not DKA; he has mild if any neuropathy of the lower extremities; he is unaware of any associated chronic complications; he has been on insulin since dx; pt says his diet and exercise are not good; he has never had pancreatitis, pancreatic surgery, severe hypoglycemia or DKA.  He was rx'ed multiple daily injections.  He is here with his PGM, who says he is dependent on her for care of his DM.  He takes his own insulin and checks his own cbg's.  However, she says he would not do so without her constant reminders.  He worked at [employer] where he worked, and he is unemployed now.  They live together.  She has been advised to pursue DPA-HC, but does not have this yet.  He brings a record of his cbg's which I have reviewed today.  It varies from 92-300, but most are in the 100's.  There is no trend throughout the day.   Past Medical History:  Diagnosis Date  . ADHD (attention deficit hyperactivity disorder)   . Anxiety   . 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 Depression   . Depression   . Diabetes mellitus without complication ([place])   . Intermittent explosive disorder   . Migraines   . Trauma     Past Surgical History:  Procedure Laterality Date  . ADENOIDECTOMY    . ANAL RECTAL MANOMETRY N/A 01/18/2016   Procedure: ANO RECTAL MANOMETRY;  Surgeon: Arta Silence, MD;  Location: [facility];  Service: Endoscopy;  Laterality: N/A;  . FRACTURE SURGERY    . MOUTH SURGERY    . TONSILLECTOMY      Social History   Socioeconomic History  . Marital status: Single    Spouse name: Not on file  . Number of children: Not on file  . Years of education: Not on file  . Highest education level: Not on file  Occupational History  . Not on file    Social Needs  . Financial resource strain: Not on file  . Food insecurity:    Worry: Not on file    Inability: Not on file  . Transportation needs:    Medical: Not on file    Non-medical: Not on file  Tobacco Use  . Smoking status: Former Research scientist (life sciences)  . Smokeless tobacco: Never Used  Substance and Sexual Activity  . Alcohol use: No  . Drug use: No  . Sexual activity: Not on file  Lifestyle  . Physical activity:    Days per week: Not on file    Minutes per session: Not on file  . Stress: Not on file  Relationships  . Social connections:    Talks on phone: Not on file    Gets together: Not on file    Attends religious service: Not on file    Active member of club or organization: Not on file    Attends meetings of clubs or organizations: Not on file    Relationship status: Not on file  . Intimate partner violence:    Fear of current or ex partner: Not on file    Emotionally abused: Not on file    Physically abused: Not on file  Forced sexual activity: Not on file  Other Topics Concern  . Not on file  Social History Narrative  . Not on file    Current Outpatient Medications on File Prior to Visit  Medication Sig Dispense Refill  . ARIPiprazole (ABILIFY) 10 MG tablet Take 10 mg by mouth daily.    . blood glucose meter kit and supplies KIT Dispense based on patient and insurance preference. Use up to four times daily as directed. (FOR ICD-9 250.00, 250.01). 1 each 0  . guanFACINE (INTUNIV) 2 MG TB24 ER tablet Take 2 mg by mouth daily. Pt takes a 67m tablet and 451mtablet together for a total dose of 34m29m   . gMarland KitchenanFACINE (INTUNIV) 4 MG TB24 ER tablet Take 4 mg by mouth daily. Pt takes a 2mg44mblet and 4mg 69mlet together for a total dose of 34mg. 2m. Insulin Pen Needle 31G X 5 MM MISC levemir 10 units SQ at bedtime, Novolog 5 units 3 times a day with meals SQ 100 each 0  . OXcarbazepine (TRILEPTAL) 150 MG tablet Take 150 mg by mouth daily.    . sertraline (ZOLOFT) 50 MG tablet  Take 50 mg by mouth daily.    . acetMarland Kitchenminophen (TYLENOL) 500 MG tablet Take 500 mg by mouth every 6 (six) hours as needed for headache.    . feeding supplement, GLUCERNA SHAKE, (GLUCERNA SHAKE) LIQD Take 237 mLs by mouth 2 (two) times daily between meals as needed (per pt preference). (Patient not taking: Reported on 12/24/2017) 10 Can 2   No current facility-administered medications on file prior to visit.     Allergies  Allergen Reactions  . Codeine Other (See Comments)    seizures  . Penicillins Other (See Comments)    Seizures  Has patient had a PCN reaction causing immediate rash, facial/tongue/throat swelling, SOB or lightheadedness with hypotension: No Has patient had a PCN reaction causing severe rash involving mucus membranes or skin necrosis: No Has patient had a PCN reaction that required hospitalization: Yes Has patient had a PCN reaction occurring within the last 10 years: No If all of the above answers are "NO", then may proceed with Cephalosporin use.   . Promethazine Hcl Other (See Comments)    Seizures.    Family History  Problem Relation Age of Onset  . Asthma Mother   . Diabetes Neg Hx     BP (!) 94/52 (BP Location: Left Arm, Patient Position: Sitting, Cuff Size: Normal)   Pulse 65   Ht 5' 3"  (1.6 m)   Wt 162 lb 9.6 oz (73.8 kg)   SpO2 97%   BMI 28.80 kg/m    Review of Systems denies fever, blurry vision, headache, chest pain, sob, n/v, urinary frequency, muscle cramps, hypoglycemia, excessive diaphoresis, memory loss, cold intolerance, and easy bruising.  He has slight intermitt nausea. He lost 70 lbs prior to the 6/19 hospitalization, but he has since regained 40 of 21at.  Depression is well-controlled.  He has rhinorrhea.    Objective:   Physical Exam VS: see vs page GEN: no distress HEAD: head: no deformity eyes: no periorbital swelling, no proptosis external nose and ears are normal mouth: no lesion seen NECK: supple, thyroid is not  enlarged CHEST WALL: no deformity LUNGS: clear to auscultation CV: reg rate and rhythm, no murmur.  ABD: abdomen is soft, nontender.  no hepatosplenomegaly.  not distended.  no hernia.  MUSCULOSKELETAL: muscle bulk and strength are grossly normal.  no obvious joint  swelling.  gait is normal and steady.   EXTEMITIES: no deformity.  no ulcer on the feet.  feet are of normal color and temp.  no edema PULSES: dorsalis pedis intact bilat.  no carotid bruit.  NEURO:  cn 2-12 grossly intact.   readily moves all 4's.  sensation is intact to touch on the feet.   SKIN:  Normal texture and temperature.  No rash or suspicious lesion is visible.   NODES:  None palpable at the neck.   PSYCH: alert, well-oriented.  Does not appear anxious nor depressed.       Lab Results  Component Value Date   HGBA1C 7.9 (A) 12/24/2017   I have reviewed outside records, and summarized: Pt was noted to have elevated a1c, and referred here.  He has had multiple ER visits, for various medical problems      Assessment & Plan:  Insulin-requiring type 2 DM, new to me Difficult domestic situation: he should take qd insulin HTN: overcontrolled.  Bipolar disorder: Abilify could exac DM, but he can't stop this.   Patient Instructions  Your blood pressure is low today.  Please see your primary care provider soon, to have it rechecked good diet and exercise significantly improve the control of your diabetes.  please let me know if you wish to be referred to a dietician.  high blood sugar is very risky to your health.  you should see an eye doctor and dentist every year.  It is very important to get all recommended vaccinations.  Controlling your blood pressure and cholesterol drastically reduces the damage diabetes does to your body.  Those who smoke should quit.  Please discuss these with your doctor.  check your blood sugar twice a day.  vary the time of day when you check, between before the 3 meals, and at bedtime.  also  check if you have symptoms of your blood sugar being too high or too low.  please keep a record of the readings and bring it to your next appointment here (or you can bring the meter itself).  You can write it on any piece of paper.  please call us sooner if your blood sugar goes below 70, or if you have a lot of readings over 200. For now, please: Stop taking the novolog, and: Change the levemir to 40 units each morning. Please call or message [place] next week, to tell us how the blood sugar is doing Please come back for a follow-up appointment in 2 months.

## 2017-12-24 NOTE — Patient Instructions (Addendum)
Your blood pressure is low today.  Please see your primary care provider soon, to have it rechecked good diet and exercise significantly improve the control of your diabetes.  please let me know if you wish to be referred to a dietician.  high blood sugar is very risky to your health.  you should see an eye doctor and dentist every year.  It is very important to get all recommended vaccinations.  Controlling your blood pressure and cholesterol drastically reduces the damage diabetes does to your body.  Those who smoke should quit.  Please discuss these with your doctor.  check your blood sugar twice a day.  vary the time of day when you check, between before the 3 meals, and at bedtime.  also check if you have symptoms of your blood sugar being too high or too low.  please keep a record of the readings and bring it to your next appointment here (or you can bring the meter itself).  You can write it on any piece of paper.  please call us sooner if your blood sugar goes below 70, or if you have a lot of readings over 200. For now, please: Stop taking the novolog, and: Change the levemir to 40 units each morning. Please call or message [place] next week, to tell us how the blood sugar is doing Please come back for a follow-up appointment in 2 months.

## 2018-01-16 ENCOUNTER — Telehealth: Payer: Self-pay

## 2018-01-16 NOTE — Telephone Encounter (Signed)
Spoke to pt grandmother Anne Ng and informed her of change also asked to call back tomorrow morning to let me know how his CBG's are

## 2018-01-16 NOTE — Telephone Encounter (Signed)
Grandmother is calling stating that she would like to have a call back today and he said that she will take the pt home and check his blood sugars to see if they have come down.

## 2018-01-16 NOTE — Telephone Encounter (Signed)
Pt blood sugar was 279 yesterday afternoon and 200 this morning  Pt is currently on 40 units of levemir please advise

## 2018-01-16 NOTE — Telephone Encounter (Signed)
Ok, please increase levemir to 45 units qam

## 2018-01-17 ENCOUNTER — Telehealth: Payer: Self-pay | Admitting: Endocrinology

## 2018-01-17 NOTE — Telephone Encounter (Signed)
Please advise on below  

## 2018-01-17 NOTE — Telephone Encounter (Signed)
Please continue 45 units qam I'll see you next time.

## 2018-01-17 NOTE — Telephone Encounter (Signed)
LVM as stated to do below

## 2018-01-17 NOTE — Telephone Encounter (Signed)
Patient grandmother stated Dr. Loanne Drilling wanted blood sugar reading. Last night 10 PM: 116 After Dr. Loanne Drilling told him to take 5 more units  Morning 7:30 AM:177  Ph # [phone] leave detailed message Please Advise.

## 2018-01-20 ENCOUNTER — Other Ambulatory Visit: Payer: Self-pay | Admitting: Endocrinology

## 2018-01-21 ENCOUNTER — Telehealth: Payer: Self-pay | Admitting: Endocrinology

## 2018-01-21 NOTE — Telephone Encounter (Signed)
Please increase the insulin to 50 units qam

## 2018-01-21 NOTE — Telephone Encounter (Signed)
Please see previous phone notes form last few days and advise, would you like for pt to make appt?

## 2018-01-21 NOTE — Telephone Encounter (Signed)
Pt  stated understanding of instructions

## 2018-01-21 NOTE — Telephone Encounter (Signed)
Pt grandmother is calling   Stating something is going on with his insulin. His sugar was 216 this morning and earlier it was 282. Need call back from nurse to discuss. Please call

## 2018-01-28 ENCOUNTER — Telehealth: Payer: Self-pay | Admitting: Endocrinology

## 2018-01-28 NOTE — Telephone Encounter (Signed)
Pt grandmother aware of increase and will call with numbers in a few days

## 2018-01-28 NOTE — Telephone Encounter (Signed)
Patient's grandmother Anne Ng ph# [phone]. She is concerned that patient's blood sugar has been running high. This morning bs 229 but around 3pm it was 375. All weekend bs ran 200. Please call the ph# above. Patient's grandmother wants to know what she should do. Patient is only taking 50 units right now.

## 2018-01-28 NOTE — Telephone Encounter (Signed)
Pt has been having elevated b/s for the last few weeks would you like for him to come in? Or increase his insulin again?

## 2018-01-28 NOTE — Telephone Encounter (Signed)
Please increase the insulin to 60 units qam

## 2018-02-24 ENCOUNTER — Ambulatory Visit (INDEPENDENT_AMBULATORY_CARE_PROVIDER_SITE_OTHER): Payer: Medicaid Other | Admitting: Endocrinology

## 2018-02-24 ENCOUNTER — Encounter: Payer: Self-pay | Admitting: Endocrinology

## 2018-02-24 VITALS — BP 100/60 | HR 56 | Ht 63.0 in | Wt 166.8 lb

## 2018-02-24 DIAGNOSIS — E119 Type 2 diabetes mellitus without complications: Secondary | ICD-10-CM

## 2018-02-24 LAB — POCT GLYCOSYLATED HEMOGLOBIN (HGB A1C): HEMOGLOBIN A1C: 8 % — AB (ref 4.0–5.6)

## 2018-02-24 MED ORDER — INSULIN DETEMIR 100 UNIT/ML FLEXPEN: 65.0000 [IU] | PEN_INJECTOR | SUBCUTANEOUS | 11 refills | Status: DC

## 2018-02-24 MED ORDER — INSULIN PEN NEEDLE 31G X 6 MM MISC: 1.0000 | Freq: Every day | 3 refills | Status: DC

## 2018-02-24 NOTE — Patient Instructions (Addendum)
check your blood sugar twice a day.  vary the time of day when you check, between before the 3 meals, and at bedtime.  also check if you have symptoms of your blood sugar being too high or too low.  please keep a record of the readings and bring it to your next appointment here (or you can bring the meter itself).  You can write it on any piece of paper.  please call us sooner if your blood sugar goes below 70, or if you have a lot of readings over 200. please increase the levemir to 65 units each morning. Please come back for a follow-up appointment in 2 months.

## 2018-02-24 NOTE — Progress Notes (Signed)
Subjective:    Patient ID: Danny Werner, male    DOB: [birth year], 22 y.o.   MRN: [id number]  HPI Pt returns for f/u of diabetes mellitus: DM type: Insulin-requiring type 2. Dx'ed: 2019, when he presented with nonketotic hyperosmolar hyperglycemic state. Complications: none Therapy: insulin since dx.  DKA: never Severe hypoglycemia: never Pancreatitis: never Pancreatic imaging: normal on 2019 CT Other: he has bipolar disorder (abilify could exac DM, but he can't stop this); his PGM, says he is dependent on her for care of his DM; he takes his own insulin and checks his own cbg's; however, she says he would not do so without her constant reminders; he takes qd insulin, after poor results with multiple daily injections Interval history: pt states he feels well in general, except for slight nausea.  no cbg record, but states cbg's vary from 130-330.   Past Medical History:  Diagnosis Date  . ADHD (attention deficit hyperactivity disorder)   . Anxiety   . 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 Depression   . Depression   . Diabetes mellitus without complication ([place])   . Intermittent explosive disorder   . Migraines   . Trauma     Past Surgical History:  Procedure Laterality Date  . ADENOIDECTOMY    . ANAL RECTAL MANOMETRY N/A 01/18/2016   Procedure: ANO RECTAL MANOMETRY;  Surgeon: Arta Silence, MD;  Location: [facility];  Service: Endoscopy;  Laterality: N/A;  . FRACTURE SURGERY    . MOUTH SURGERY    . TONSILLECTOMY      Social History   Socioeconomic History  . Marital status: Single    Spouse name: Not on file  . Number of children: Not on file  . Years of education: Not on file  . Highest education level: Not on file  Occupational History  . Not on file  Social Needs  . Financial resource strain: Not on file  . Food insecurity:    Worry: Not on file    Inability: Not on file  . Transportation needs:    Medical: Not on file    Non-medical: Not on file    Tobacco Use  . Smoking status: Former Research scientist (life sciences)  . Smokeless tobacco: Never Used  Substance and Sexual Activity  . Alcohol use: No  . Drug use: No  . Sexual activity: Not on file  Lifestyle  . Physical activity:    Days per week: Not on file    Minutes per session: Not on file  . Stress: Not on file  Relationships  . Social connections:    Talks on phone: Not on file    Gets together: Not on file    Attends religious service: Not on file    Active member of club or organization: Not on file    Attends meetings of clubs or organizations: Not on file    Relationship status: Not on file  . Intimate partner violence:    Fear of current or ex partner: Not on file    Emotionally abused: Not on file    Physically abused: Not on file    Forced sexual activity: Not on file  Other Topics Concern  . Not on file  Social History Narrative  . Not on file    Current Outpatient Medications on File Prior to Visit  Medication Sig Dispense Refill  . acetaminophen (TYLENOL) 500 MG tablet Take 500 mg by mouth every 6 (six) hours as needed for headache.    Marland Kitchen  ARIPiprazole (ABILIFY) 10 MG tablet Take 10 mg by mouth daily.    . blood glucose meter kit and supplies KIT Dispense based on patient and insurance preference. Use up to four times daily as directed. (FOR ICD-9 250.00, 250.01). 1 each 0  . guanFACINE (INTUNIV) 2 MG TB24 ER tablet Take 2 mg by mouth daily. Pt takes a 47m tablet and 441mtablet together for a total dose of 14m35m   . gMarland KitchenanFACINE (INTUNIV) 4 MG TB24 ER tablet Take 4 mg by mouth daily. Pt takes a 2mg55mblet and 4mg 70mlet together for a total dose of 14mg. 23m. sertraline (ZOLOFT) 50 MG tablet Take 50 mg by mouth daily.     No current facility-administered medications on file prior to visit.     Allergies  Allergen Reactions  . Codeine Other (See Comments)    seizures  . Penicillins Other (See Comments)    Seizures  Has patient had a PCN reaction causing immediate rash,  facial/tongue/throat swelling, SOB or lightheadedness with hypotension: No Has patient had a PCN reaction causing severe rash involving mucus membranes or skin necrosis: No Has patient had a PCN reaction that required hospitalization: Yes Has patient had a PCN reaction occurring within the last 10 years: No If all of the above answers are "NO", then may proceed with Cephalosporin use.   . Promethazine Hcl Other (See Comments)    Seizures.    Family History  Problem Relation Age of Onset  . Asthma Mother   . Diabetes Neg Hx     BP 100/60 (BP Location: Right Arm, Patient Position: Sitting, Cuff Size: Normal)   Pulse (!) 56   Ht 5' 3"  (1.6 m)   Wt 166 lb 12.8 oz (75.7 kg)   SpO2 98%   BMI 29.55 kg/m    Review of Systems He denies hypoglycemia.       Objective:   Physical Exam VITAL SIGNS:  See vs page GENERAL: no distress Pulses: foot pulses are intact bilaterally.   MSK: no deformity of the feet or ankles.  CV: no edema of the legs or ankles Skin:  no ulcer on the feet or ankles.  normal color and temp on the feet and ankles Neuro: sensation is intact to touch on the feet and ankles.      Lab Results  Component Value Date   HGBA1C 8.0 (A) 02/24/2018       Assessment & Plan:  Insulin-requiring type 2 DM: he needs increased rx.    Patient Instructions  check your blood sugar twice a day.  vary the time of day when you check, between before the 3 meals, and at bedtime.  also check if you have symptoms of your blood sugar being too high or too low.  please keep a record of the readings and bring it to your next appointment here (or you can bring the meter itself).  You can write it on any piece of paper.  please call us [facility] if your blood sugar goes below 70, or if you have a lot of readings over 200. please increase the levemir to 65 units each morning. Please come back for a follow-up appointment in 2 months.

## 2018-03-10 ENCOUNTER — Telehealth: Payer: Self-pay | Admitting: Endocrinology

## 2018-03-10 ENCOUNTER — Other Ambulatory Visit: Payer: Self-pay

## 2018-03-10 MED ORDER — ACCU-CHEK AVIVA PLUS W/DEVICE KIT: PACK | 0 refills | Status: DC
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
## 2018-03-10 NOTE — Telephone Encounter (Signed)
Received notification from Kristopher Oppenheim re: pt request to refill Accu Chek Aviva Plus meter. Request authorized and refill sent as requested.

## 2018-04-28 ENCOUNTER — Ambulatory Visit: Payer: Medicaid Other | Admitting: Endocrinology

## 2018-04-30 ENCOUNTER — Ambulatory Visit (INDEPENDENT_AMBULATORY_CARE_PROVIDER_SITE_OTHER): Payer: Medicaid Other | Admitting: Endocrinology

## 2018-04-30 ENCOUNTER — Encounter: Payer: Self-pay | Admitting: Endocrinology

## 2018-04-30 VITALS — BP 104/60 | HR 52 | Ht 63.0 in | Wt 163.6 lb

## 2018-04-30 DIAGNOSIS — E119 Type 2 diabetes mellitus without complications: Secondary | ICD-10-CM

## 2018-04-30 LAB — POCT GLYCOSYLATED HEMOGLOBIN (HGB A1C): Hemoglobin A1C: 10.7 % — AB (ref 4.0–5.6)

## 2018-04-30 MED ORDER — INSULIN DETEMIR 100 UNIT/ML FLEXPEN: 75.0000 [IU] | PEN_INJECTOR | SUBCUTANEOUS | 11 refills | Status: DC

## 2018-04-30 NOTE — Patient Instructions (Signed)
check your blood sugar twice a day.  vary the time of day when you check, between before the 3 meals, and at bedtime.  also check if you have symptoms of your blood sugar being too high or too low.  please keep a record of the readings and bring it to your next appointment here (or you can bring the meter itself).  You can write it on any piece of paper.  please call us sooner if your blood sugar goes below 70, or if you have a lot of readings over 200. please increase the levemir to 75 units each morning.   Please come back for a follow-up appointment in 2 months.

## 2018-04-30 NOTE — Progress Notes (Signed)
Subjective:    Patient ID: Danny Werner, male    DOB: 1995/06/20, 23 y.o.   MRN: [id number]  HPI Pt returns for f/u of diabetes mellitus: DM type: Insulin-requiring type 2. Dx'ed: 2019, when he presented with nonketotic hyperosmolar hyperglycemic state. Complications: none Therapy: insulin since dx.  DKA: never Severe hypoglycemia: never Pancreatitis: never Pancreatic imaging: normal on 2019 CT Other: he has bipolar disorder (abilify could exac DM, but he can't stop this); his PGM, says he is dependent on her for care of his DM; he takes his own insulin and checks his own cbg's; however, she says he would not do so without her constant reminders; he takes qd insulin, after poor results with multiple daily injections Interval history: pt states he feels well in general, except for slight nausea.  no cbg record, but states cbg's are in the high-200's.   Past Medical History:  Diagnosis Date  . ADHD (attention deficit hyperactivity disorder)   . Anxiety   . 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 Depression   . Depression   . Diabetes mellitus without complication ([place])   . Intermittent explosive disorder   . Migraines   . Trauma     Past Surgical History:  Procedure Laterality Date  . ADENOIDECTOMY    . ANAL RECTAL MANOMETRY N/A 01/18/2016   Procedure: ANO RECTAL MANOMETRY;  Surgeon: Arta Silence, MD;  Location: [facility];  Service: Endoscopy;  Laterality: N/A;  . FRACTURE SURGERY    . MOUTH SURGERY    . TONSILLECTOMY      Social History   Socioeconomic History  . Marital status: Single    Spouse name: Not on file  . Number of children: Not on file  . Years of education: Not on file  . Highest education level: Not on file  Occupational History  . Not on file  Social Needs  . Financial resource strain: Not on file  . Food insecurity:    Worry: Not on file    Inability: Not on file  . Transportation needs:    Medical: Not on file    Non-medical: Not on file    Tobacco Use  . Smoking status: Former Research scientist (life sciences)  . Smokeless tobacco: Never Used  Substance and Sexual Activity  . Alcohol use: No  . Drug use: No  . Sexual activity: Not on file  Lifestyle  . Physical activity:    Days per week: Not on file    Minutes per session: Not on file  . Stress: Not on file  Relationships  . Social connections:    Talks on phone: Not on file    Gets together: Not on file    Attends religious service: Not on file    Active member of club or organization: Not on file    Attends meetings of clubs or organizations: Not on file    Relationship status: Not on file  . Intimate partner violence:    Fear of current or ex partner: Not on file    Emotionally abused: Not on file    Physically abused: Not on file    Forced sexual activity: Not on file  Other Topics Concern  . Not on file  Social History Narrative  . Not on file    Current Outpatient Medications on File Prior to Visit  Medication Sig Dispense Refill  . acetaminophen (TYLENOL) 500 MG tablet Take 500 mg by mouth every 6 (six) hours as needed for  headache.    . ARIPiprazole (ABILIFY) 10 MG tablet Take 10 mg by mouth daily.    . blood glucose meter kit and supplies KIT Dispense based on patient and insurance preference. Use up to four times daily as directed. (FOR ICD-9 250.00, 250.01). 1 each 0  . Blood Glucose Monitoring Suppl (ACCU-CHEK AVIVA PLUS) w/Device KIT Use to monitor glucose levels up to 4 times per day 1 kit 0  . guanFACINE (INTUNIV) 2 MG TB24 ER tablet Take 2 mg by mouth daily. Pt takes a 73m tablet and 448mtablet together for a total dose of 53m57m   . gMarland KitchenanFACINE (INTUNIV) 4 MG TB24 ER tablet Take 4 mg by mouth daily. Pt takes a 2mg32mblet and 4mg 69mlet together for a total dose of 53mg. 14m. Insulin Pen Needle 31G X 6 MM MISC 1 Device by Does not apply route daily. 100 each 3  . sertraline (ZOLOFT) 50 MG tablet Take 50 mg by mouth daily.     No current facility-administered medications  on file prior to visit.     Allergies  Allergen Reactions  . Codeine Other (See Comments)    seizures  . Penicillins Other (See Comments)    Seizures  Has patient had a PCN reaction causing immediate rash, facial/tongue/throat swelling, SOB or lightheadedness with hypotension: No Has patient had a PCN reaction causing severe rash involving mucus membranes or skin necrosis: No Has patient had a PCN reaction that required hospitalization: Yes Has patient had a PCN reaction occurring within the last 10 years: No If all of the above answers are "NO", then may proceed with Cephalosporin use.   . Promethazine Hcl Other (See Comments)    Seizures.    Family History  Problem Relation Age of Onset  . Asthma Mother   . Diabetes Neg Hx     BP 104/60 (BP Location: Left Arm, Patient Position: Sitting, Cuff Size: Normal)   Pulse (!) 52   Ht _0  (1.6 m)   Wt 163 lb 9.6 oz (74.2 kg)   SpO2 97%   BMI 28.98 kg/m    Review of Systems He denies hypoglycemia    Objective:   Physical Exam VITAL SIGNS:  See vs page GENERAL: no distress Pulses: dorsalis pedis intact bilat.   MSK: no deformity of the feet CV: no leg edema Skin:  no ulcer on the feet.  normal color and temp on the feet.  Neuro: sensation is intact to touch on the feet.    A1c=10.7%     Assessment & Plan:  Insulin-requiring type 2 DM: worse. Bipolar disorder: despite effect of glucose, he can continue abilify in my opinion  Patient Instructions  check your blood sugar twice a day.  vary the time of day when you check, between before the 3 meals, and at bedtime.  also check if you have symptoms of your blood sugar being too high or too low.  please keep a record of the readings and bring it to your next appointment here (or you can bring the meter itself).  You can write it on any piece of paper.  please call us [facility] if your blood sugar goes below 70, or if you have a lot of readings over 200. please increase the  levemir to 75 units each morning.   Please come back for a follow-up appointment in 2 months.

## 2018-06-19 ENCOUNTER — Telehealth: Payer: Self-pay | Admitting: Endocrinology

## 2018-06-19 NOTE — Telephone Encounter (Signed)
Phone call returned. Information relayed to patient via nurse assistance was

## 2018-06-19 NOTE — Telephone Encounter (Signed)
Called pt to inquire further about options. LVM requesting returned call.

## 2018-06-19 NOTE — Telephone Encounter (Signed)
please contact patient: We received PA from medicaid.  Options are: 1.  Change levemir to vials 2.  Change to lantus in pens--in which case, we would need to slightly decrease the dosage, since it is not a unit-for-unit change.

## 2018-06-20 NOTE — Telephone Encounter (Signed)
SECOND ATTEMPT:  Called pt to inquire if he had made a decision about the options Dr. Loanne Drilling provided to him. LVM again requesting returned call.

## 2018-06-23 NOTE — Telephone Encounter (Signed)
FINAL ATTEMPT:  Called pt to inquire if decision had been made re: change to insulin LVM requesting returned call. Letter also mailed

## 2018-06-24 ENCOUNTER — Telehealth: Payer: Self-pay

## 2018-06-24 ENCOUNTER — Other Ambulatory Visit: Payer: Self-pay

## 2018-06-24 ENCOUNTER — Ambulatory Visit (INDEPENDENT_AMBULATORY_CARE_PROVIDER_SITE_OTHER): Payer: Medicaid Other | Admitting: Endocrinology

## 2018-06-24 VITALS — BP 108/58 | HR 73 | Ht 63.0 in | Wt 163.8 lb

## 2018-06-24 DIAGNOSIS — E119 Type 2 diabetes mellitus without complications: Secondary | ICD-10-CM

## 2018-06-24 LAB — POCT GLYCOSYLATED HEMOGLOBIN (HGB A1C): Hemoglobin A1C: 11.7 % — AB (ref 4.0–5.6)

## 2018-06-24 MED ORDER — INSULIN NPH ISOPHANE & REGULAR (70-30) 100 UNIT/ML ~~LOC~~ SUSP: SUBCUTANEOUS | 11 refills | Status: DC

## 2018-06-24 MED ORDER — "INSULIN SYRINGE-NEEDLE U-100 31G X 5/16"" 1 ML MISC": 1.0000 | Freq: Two times a day (BID) | 2 refills | Status: DC

## 2018-06-24 NOTE — Progress Notes (Signed)
Subjective:    Patient ID: Danny Werner, male    DOB: 10-Nov-1995, 23 y.o.   MRN: [id number]  HPI Pt returns for f/u of diabetes mellitus: DM type: Insulin-requiring type 2. Dx'ed: 2019, when he presented with nonketotic hyperosmolar hyperglycemic state.  Complications: none Therapy: insulin since dx.  DKA: never Severe hypoglycemia: never Pancreatitis: never Pancreatic imaging: normal on 2019 CT Other: he has bipolar disorder (abilify could exac DM, but he can't stop this); his PGM, says he is dependent on her for care of his DM; he takes his own insulin and checks his own cbg's; however, she says he would not do so without her constant reminders; he takes qd insulin, after poor results with multiple daily injections.   Interval history: pt states he feels well in general, except for slight headache. Meter is downloaded today, and the printout is scanned into the record.  cbg's vary from the [phone].  Pt says he never misses the insulin.  He says a pen lasts 3 days.  He says the insulin does not leak out.  He says his diet is poor.  He works at [employer].  He has applied for an orange card, but has not yet received.   Past Medical History:  Diagnosis Date  . ADHD (attention deficit hyperactivity disorder)   . Anxiety   . 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 Depression   . Depression   . Diabetes mellitus without complication ([place])   . Intermittent explosive disorder   . Migraines   . Trauma     Past Surgical History:  Procedure Laterality Date  . ADENOIDECTOMY    . ANAL RECTAL MANOMETRY N/A 01/18/2016   Procedure: ANO RECTAL MANOMETRY;  Surgeon: Arta Silence, MD;  Location: [facility];  Service: Endoscopy;  Laterality: N/A;  . FRACTURE SURGERY    . MOUTH SURGERY    . TONSILLECTOMY      Social History   Socioeconomic History  . Marital status: Single    Spouse name: Not on file  . Number of children: Not on file  . Years of education: Not on file  .  Highest education level: Not on file  Occupational History  . Not on file  Social Needs  . Financial resource strain: Not on file  . Food insecurity:    Worry: Not on file    Inability: Not on file  . Transportation needs:    Medical: Not on file    Non-medical: Not on file  Tobacco Use  . Smoking status: Former Research scientist (life sciences)  . Smokeless tobacco: Never Used  Substance and Sexual Activity  . Alcohol use: No  . Drug use: No  . Sexual activity: Not on file  Lifestyle  . Physical activity:    Days per week: Not on file    Minutes per session: Not on file  . Stress: Not on file  Relationships  . Social connections:    Talks on phone: Not on file    Gets together: Not on file    Attends religious service: Not on file    Active member of club or organization: Not on file    Attends meetings of clubs or organizations: Not on file    Relationship status: Not on file  . Intimate partner violence:    Fear of current or ex partner: Not on file    Emotionally abused: Not on file    Physically abused: Not on file  Forced sexual activity: Not on file  Other Topics Concern  . Not on file  Social History Narrative  . Not on file    Current Outpatient Medications on File Prior to Visit  Medication Sig Dispense Refill  . acetaminophen (TYLENOL) 500 MG tablet Take 500 mg by mouth every 6 (six) hours as needed for headache.    . ARIPiprazole (ABILIFY) 10 MG tablet Take 10 mg by mouth daily.    . blood glucose meter kit and supplies KIT Dispense based on patient and insurance preference. Use up to four times daily as directed. (FOR ICD-9 250.00, 250.01). 1 each 0  . Blood Glucose Monitoring Suppl (ACCU-CHEK AVIVA PLUS) w/Device KIT Use to monitor glucose levels up to 4 times per day 1 kit 0  . guanFACINE (INTUNIV) 2 MG TB24 ER tablet Take 2 mg by mouth daily. Pt takes a 28m tablet and 480mtablet together for a total dose of 40m240m   . gMarland KitchenanFACINE (INTUNIV) 4 MG TB24 ER tablet Take 4 mg by mouth  daily. Pt takes a 2mg52mblet and 4mg 50mlet together for a total dose of 40mg. 37m. Insulin Pen Needle 31G X 6 MM MISC 1 Device by Does not apply route daily. 100 each 3  . sertraline (ZOLOFT) 50 MG tablet Take 50 mg by mouth daily.     No current facility-administered medications on file prior to visit.     Allergies  Allergen Reactions  . Codeine Other (See Comments)    seizures  . Penicillins Other (See Comments)    Seizures  Has patient had a PCN reaction causing immediate rash, facial/tongue/throat swelling, SOB or lightheadedness with hypotension: No Has patient had a PCN reaction causing severe rash involving mucus membranes or skin necrosis: No Has patient had a PCN reaction that required hospitalization: Yes Has patient had a PCN reaction occurring within the last 10 years: No If all of the above answers are "NO", then may proceed with Cephalosporin use.   . Promethazine Hcl Other (See Comments)    Seizures.    Family History  Problem Relation Age of Onset  . Asthma Mother   . Diabetes Neg Hx     BP (!) 108/58 (BP Location: Right Arm, Patient Position: Sitting, Cuff Size: Normal)   Pulse 73   Ht _0  (1.6 m)   Wt 163 lb 12.8 oz (74.3 kg)   SpO2 97%   BMI 29.02 kg/m    Review of Systems He denies hypoglycemia.      Objective:   Physical Exam VITAL SIGNS:  See vs page GENERAL: no distress Pulses: dorsalis pedis intact bilat.   MSK: no deformity of the feet.  CV: no leg edema.   Skin:  no ulcer on the feet.  normal color and temp on the feet. Neuro: sensation is intact to touch on the feet.    Lab Results  Component Value Date   HGBA1C 11.7 (A) 06/24/2018        Assessment & Plan:  Insulin-requiring type 2 DM: poor glycemic control.  therapy limited by financial considerations.     Patient Instructions  check your blood sugar twice a day.  vary the time of day when you check, between before the 3 meals, and at bedtime.  also check if you have  symptoms of your blood sugar being too high or too low.  please keep a record of the readings and bring it to your next appointment here (or  you can bring the meter itself).  You can write it on any piece of paper.  please call us sooner if your blood sugar goes below 70, or if you have a lot of readings over 200. please change the levemir to "70/30" insulin, 50 units with breakfast, and 20 with supper.  I have sent a prescription to your pharmacy Please come back for a follow-up appointment in 2 weeks.

## 2018-06-24 NOTE — Telephone Encounter (Signed)
Opened in error

## 2018-06-24 NOTE — Telephone Encounter (Signed)
Pt grandmother called expressing discontent about pt Rx. States she called [facility] and was informed they never received Rx. Reassured grandmother that the Rx had been sent. Pt grandmother proceeded to then inform me that "this pt does not have the mind set of a normal 23 year old", further adding "does not have the mental capability to draw up insulin". Pt grandmother further states, "he can administer himself but cannot draw up insulin". Asked if she felt comfortable drawing up this pt insulin in advance, placing a label on insulin syringe to indicate time of day to administer insulin and placing in a safe container clearly labeled with time of day in which patient can safely administer insulin? Further advised this method would require keeping insulin stored in refrigerator for patient to easily access and administer at the correct time of day. Confirmed she and pt could utilize this method. Reminded grandmother that there is many issues to take in to consideration for why this medication was chosen.  1. Cost = $24.88/vial @ [facility] (2 vials x $24.88 = $49.76/month on this current dosage regimen) 2. Pt does not have insurance. Mother and pt agreed that he could afford this medication until he obtains [employer]. 3. While pens would ease pt use and compliance, pt cannot afford pens at this time. Therefore this is not an option at this time.  Reassured grandmother that I would call pharmacy to clarify confusion and to ensure Rx is being filled. Pt grandmother then proceeded to hang up on this nurse.  [place] and spoke with pharmacist. Confirmed receipt of Rx. States she is processing now.  LVM for grandmother to return my call.  While typing this note, pt grandmother returned my call. Informed of information obtained from [facility]. Verbalized appreciation and understanding. Also reminded of best course of action to address her concern with pt drawing up medication.  Verbalized acceptance and understanding of drawing up medication in advance to ease her worries of pt incorrectly drawing up insulin dosage. Again expressed appreciation of all assistance and directions.

## 2018-06-24 NOTE — Patient Instructions (Addendum)
check your blood sugar twice a day.  vary the time of day when you check, between before the 3 meals, and at bedtime.  also check if you have symptoms of your blood sugar being too high or too low.  please keep a record of the readings and bring it to your next appointment here (or you can bring the meter itself).  You can write it on any piece of paper.  please call us sooner if your blood sugar goes below 70, or if you have a lot of readings over 200. please change the levemir to "70/30" insulin, 50 units with breakfast, and 20 with supper.  I have sent a prescription to your pharmacy Please come back for a follow-up appointment in 2 weeks.

## 2018-07-02 ENCOUNTER — Ambulatory Visit: Payer: Medicaid Other | Admitting: Endocrinology

## 2018-07-23 ENCOUNTER — Telehealth: Payer: Self-pay

## 2018-07-23 NOTE — Telephone Encounter (Signed)
Received notification from [facility] requesting PA be completed for Levemir. Discussed request with Dr. Loanne Drilling. Orders received to discontinue request for PA, further adding pt is no longer taking Levemir. Advised pt continue 70/30 insulin as previously ordered. Called [facility] and informed of above information. States they will discontinue request.

## 2018-08-07 IMAGING — CT CT ABD-PELV W/ CM
2 of 4 series · 15 of 46 positions shown, 17 images · IV contrast (ISOVUE)
Comparison: None.

CLINICAL DATA: Right-sided abdominal pain

EXAM:
CT ABDOMEN AND PELVIS WITH CONTRAST
TECHNIQUE: Multidetector CT imaging of the abdomen and pelvis was performed
using the standard protocol following bolus administration of
intravenous contrast.
CONTRAST:  100mL [id number]-[id number] IOPAMIDOL (M8Y9JJ-BAA) INJECTION 61%

[Series 2: axial st · axial · 0.63mm/px · z∈[+1058,+1498]mm · 12 of 100 slices shown, 14 images]
[im 6/100  soft-tissue]
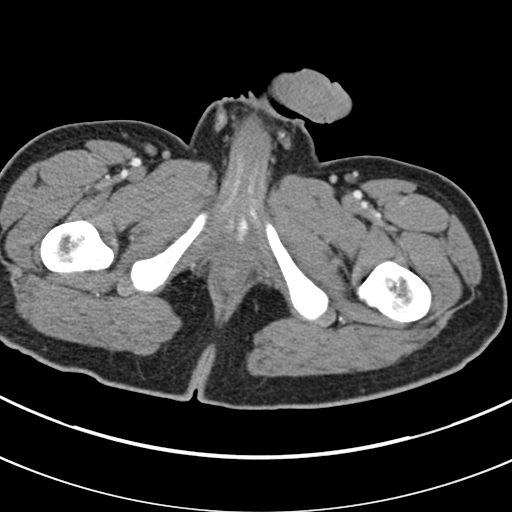
[im 6/100  bone]
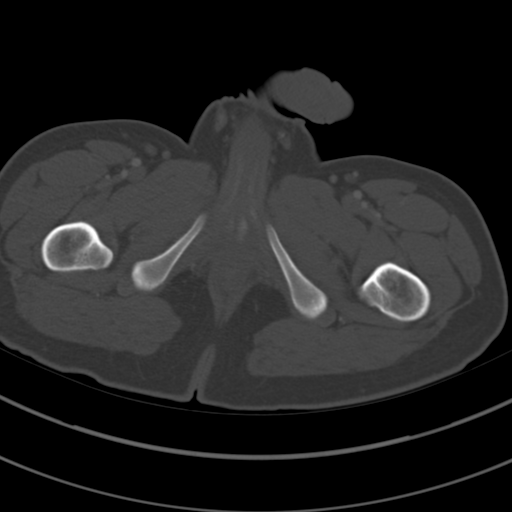
[im 16/100  soft-tissue]
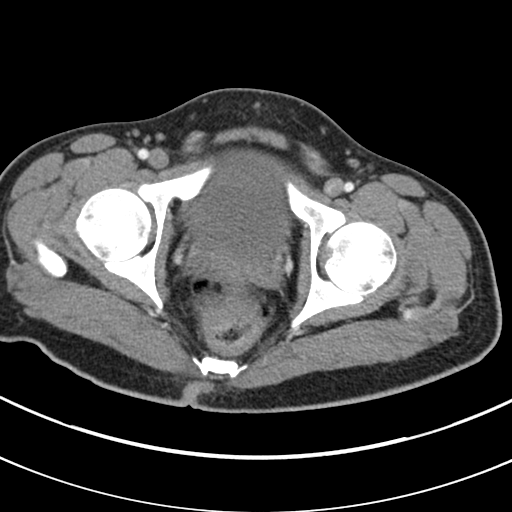
[im 21/100  soft-tissue]
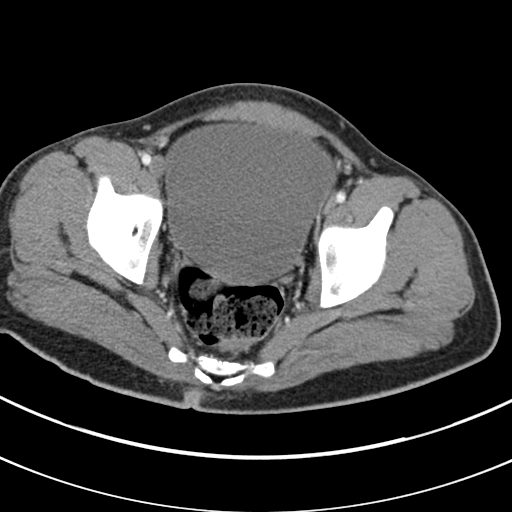
[im 32/100  soft-tissue]
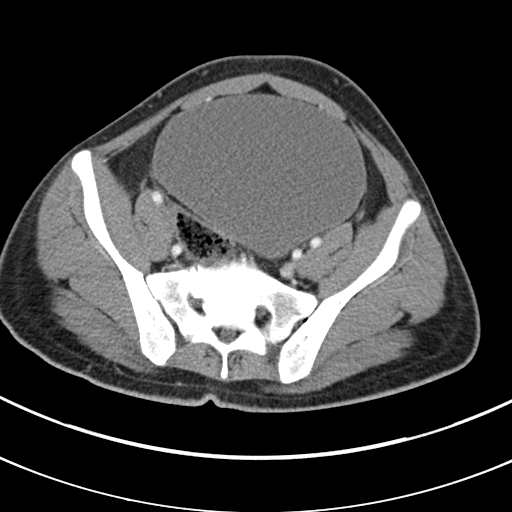
[im 37/100  soft-tissue]
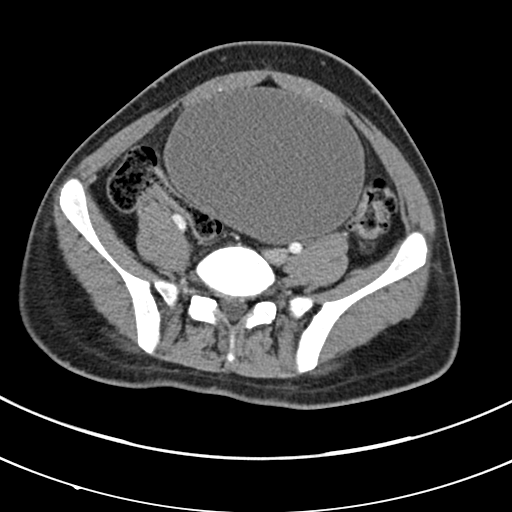
[im 47/100  soft-tissue]
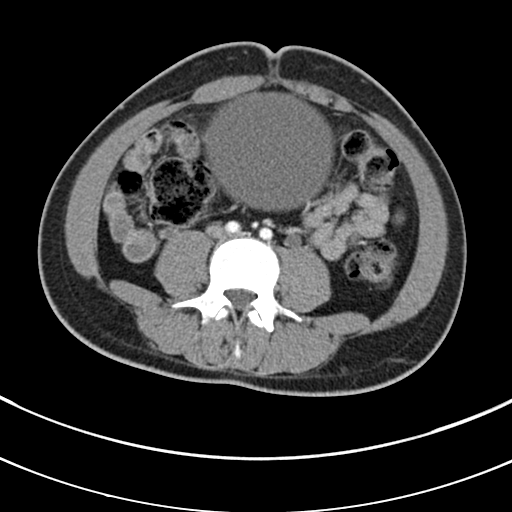
[im 53/100  soft-tissue]
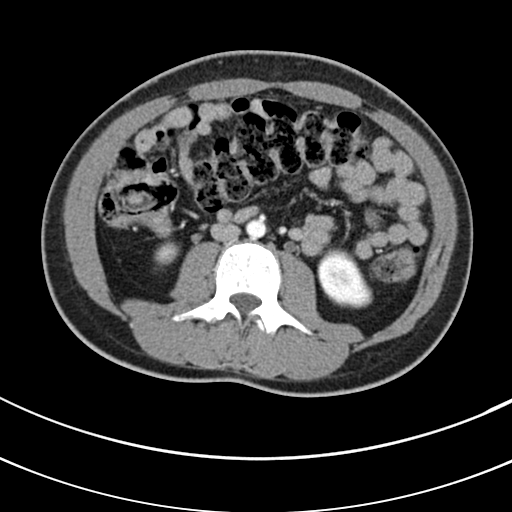
[im 63/100  soft-tissue]
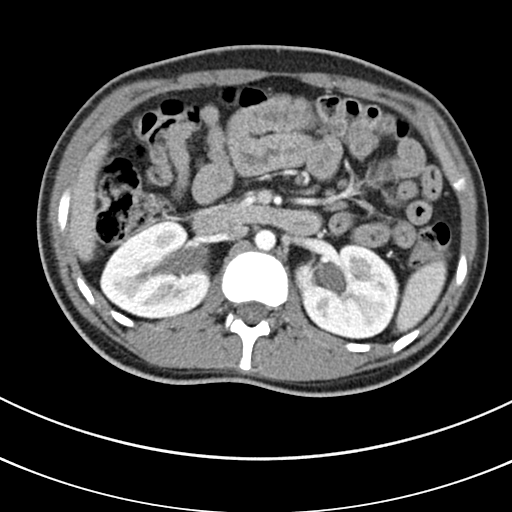
[im 68/100  soft-tissue]
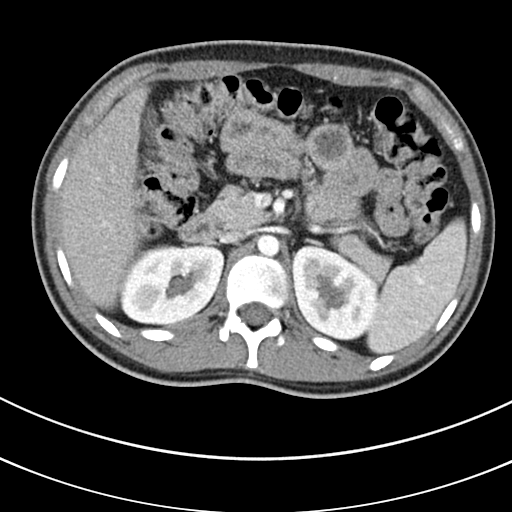
[im 68/100  bone]
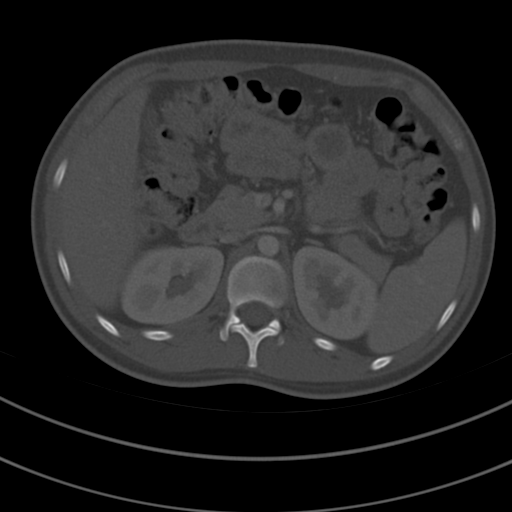
[im 79/100  soft-tissue]
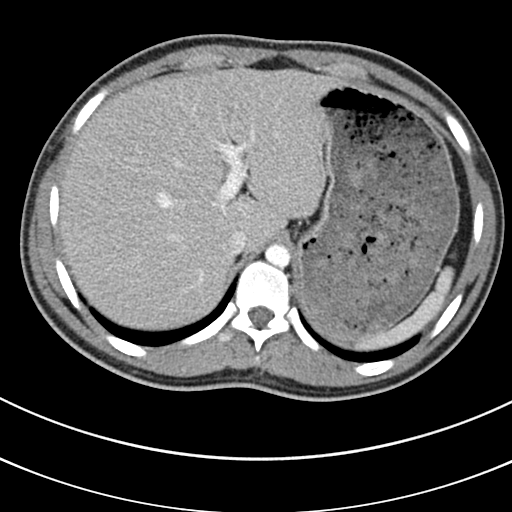
[im 84/100  soft-tissue]
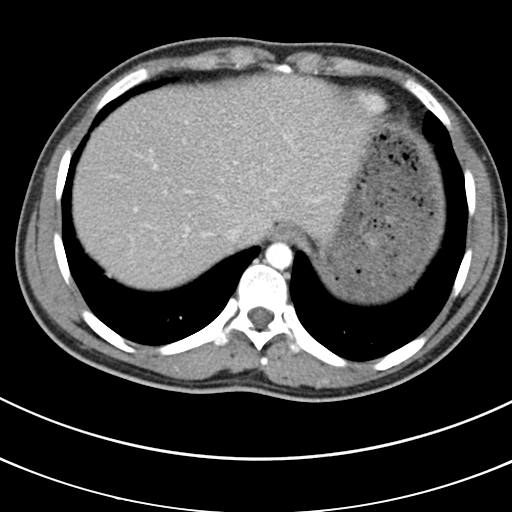
[im 94/100  soft-tissue]
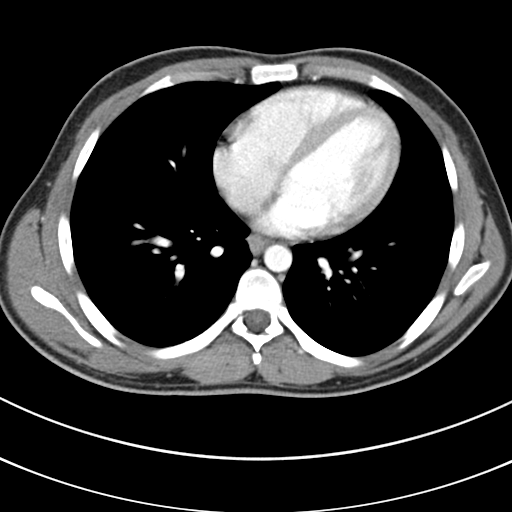

[Series 5: coronal st · coronal · 0.66mm/px · 3 of 82 slices shown]
[im 28/82  soft-tissue]
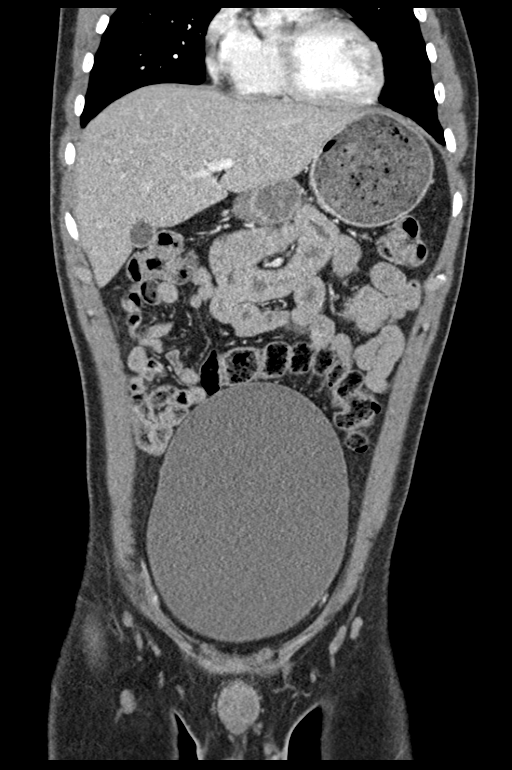
[im 37/82  soft-tissue]
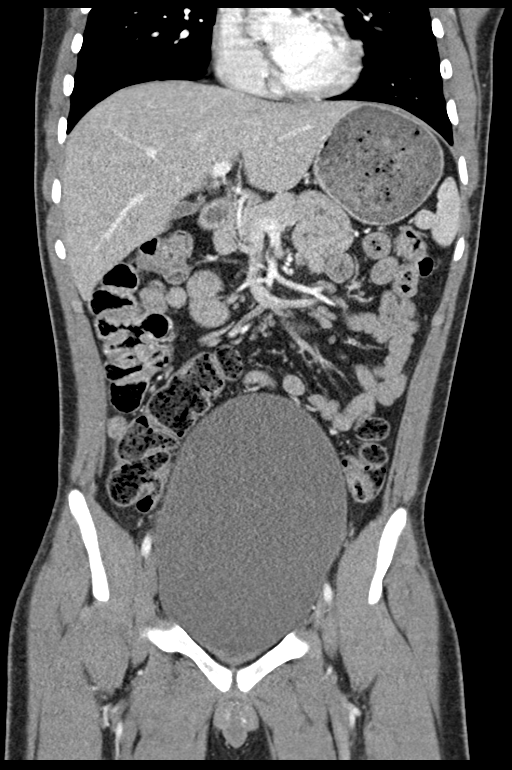
[im 46/82  soft-tissue]
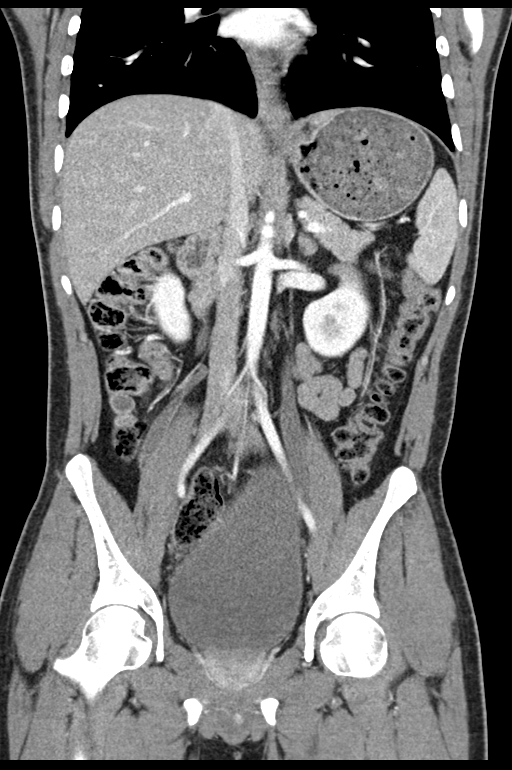

[15 of 46 positions shown; findings below may reference images not displayed]

FINDINGS: Lower chest: No acute abnormality.

Hepatobiliary: No focal liver abnormality is seen. No gallstones,
gallbladder wall thickening, or biliary dilatation.

Pancreas: Unremarkable. No pancreatic ductal dilatation or
surrounding inflammatory changes.

Spleen: Normal in size without focal abnormality.

Adrenals/Urinary Tract: Adrenal glands are within normal limits.
Enlarged renal pelvises and prominent ureters with markedly
distended urinary bladder.

Stomach/Bowel: Stomach is within normal limits. Appendix appears
normal. No evidence of bowel wall thickening, distention, or
inflammatory changes.

Vascular/Lymphatic: No significant vascular findings are present. No
enlarged abdominal or pelvic lymph nodes.

Reproductive: Prostate is unremarkable.

Other: Negative for free air or free fluid

Musculoskeletal: No acute or significant osseous findings.
IMPRESSION: 1. No CT evidence for acute intra-abdominal or pelvic abnormality.
Negative for appendicitis.
2. Markedly enlarged urinary bladder. Dilated renal pelvises
bilaterally with slightly enlarged ureters, possibly due to over
distention of the bladder.

## 2018-10-16 ENCOUNTER — Other Ambulatory Visit: Payer: Self-pay

## 2018-10-21 ENCOUNTER — Ambulatory Visit (INDEPENDENT_AMBULATORY_CARE_PROVIDER_SITE_OTHER): Payer: BLUE CROSS/BLUE SHIELD | Admitting: Endocrinology

## 2018-10-21 ENCOUNTER — Other Ambulatory Visit: Payer: Self-pay

## 2018-10-21 ENCOUNTER — Encounter: Payer: Self-pay | Admitting: Endocrinology

## 2018-10-21 VITALS — BP 108/70 | HR 91 | Ht 63.0 in | Wt 173.0 lb

## 2018-10-21 DIAGNOSIS — E109 Type 1 diabetes mellitus without complications: Secondary | ICD-10-CM | POA: Diagnosis not present

## 2018-10-21 DIAGNOSIS — E119 Type 2 diabetes mellitus without complications: Secondary | ICD-10-CM

## 2018-10-21 LAB — POCT GLYCOSYLATED HEMOGLOBIN (HGB A1C): Hemoglobin A1C: 10.1 % — AB (ref 4.0–5.6)

## 2018-10-21 MED ORDER — NOVOLIN 70/30 (70-30) 100 UNIT/ML ~~LOC~~ SUSP: SUBCUTANEOUS | 11 refills | Status: DC

## 2018-10-21 NOTE — Patient Instructions (Signed)
check your blood sugar twice a day.  vary the time of day when you check, between before the 3 meals, and at bedtime.  also check if you have symptoms of your blood sugar being too high or too low.  please keep a record of the readings and bring it to your next appointment here (or you can bring the meter itself).  You can write it on any piece of paper.  please call us sooner if your blood sugar goes below 70, or if you have a lot of readings over 200. please change the 70/30 insulin, 45 units with breakfast, and 30 with supper.  I have sent a prescription to your pharmacy. Please come back for a follow-up appointment in 2 months.

## 2018-10-21 NOTE — Progress Notes (Signed)
Subjective:    Patient ID: Danny Werner, male    DOB: [birth year], 23 y.o.   MRN: [id number]  HPI Pt returns for f/u of diabetes mellitus: DM type: 1 Dx'ed: 2019, when he presented with nonketotic hyperosmolar hyperglycemic state.  Complications: none Therapy: insulin since dx.  DKA: never Severe hypoglycemia: never Pancreatitis: never Pancreatic imaging: normal on 2019 CT Other: he has bipolar disorder (abilify could exac DM, but he can't stop this); his PGM says he is dependent on her for care of his DM; he takes his own insulin and checks his own cbg's; however, she says he would not do so without her constant reminders; he takes BID insulin, after poor results with multiple daily injections; he prefers syringe and vial over pens.   Interval history: Pt says cbg varies from 71-230.  It is in general lowest at lunch, and highest at HS.  Pt says he never misses the insulin.  He now has [place].  pt states he feels well in general.   Past Medical History:  Diagnosis Date  . ADHD (attention deficit hyperactivity disorder)   . Anxiety   . 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 Depression   . Depression   . Diabetes mellitus without complication ([place])   . Intermittent explosive disorder   . Migraines   . Trauma     Past Surgical History:  Procedure Laterality Date  . ADENOIDECTOMY    . ANAL RECTAL MANOMETRY N/A 01/18/2016   Procedure: ANO RECTAL MANOMETRY;  Surgeon: Arta Silence, MD;  Location: [facility];  Service: Endoscopy;  Laterality: N/A;  . FRACTURE SURGERY    . MOUTH SURGERY    . TONSILLECTOMY      Social History   Socioeconomic History  . Marital status: Single    Spouse name: Not on file  . Number of children: Not on file  . Years of education: Not on file  . Highest education level: Not on file  Occupational History  . Not on file  Social Needs  . Financial resource strain: Not on file  . Food insecurity    Worry: Not on file    Inability: Not on file   . Transportation needs    Medical: Not on file    Non-medical: Not on file  Tobacco Use  . Smoking status: Former Research scientist (life sciences)  . Smokeless tobacco: Never Used  Substance and Sexual Activity  . Alcohol use: No  . Drug use: No  . Sexual activity: Not on file  Lifestyle  . Physical activity    Days per week: Not on file    Minutes per session: Not on file  . Stress: Not on file  Relationships  . Social Herbalist on phone: Not on file    Gets together: Not on file    Attends religious service: Not on file    Active member of club or organization: Not on file    Attends meetings of clubs or organizations: Not on file    Relationship status: Not on file  . Intimate partner violence    Fear of current or ex partner: Not on file    Emotionally abused: Not on file    Physically abused: Not on file    Forced sexual activity: Not on file  Other Topics Concern  . Not on file  Social History Narrative  . Not on file    Current Outpatient Medications on File Prior to Visit  Medication Sig Dispense Refill  . acetaminophen (TYLENOL) 500 MG tablet Take 500 mg by mouth every 6 (six) hours as needed for headache.    . ARIPiprazole (ABILIFY) 10 MG tablet Take 10 mg by mouth daily.    . blood glucose meter kit and supplies KIT Dispense based on patient and insurance preference. Use up to four times daily as directed. (FOR ICD-9 250.00, 250.01). 1 each 0  . Blood Glucose Monitoring Suppl (ACCU-CHEK AVIVA PLUS) w/Device KIT Use to monitor glucose levels up to 4 times per day 1 kit 0  . guanFACINE (INTUNIV) 2 MG TB24 ER tablet Take 2 mg by mouth daily. Pt takes a 174m tablet and 459mtablet together for a total dose of 74m74m   . gMarland KitchenanFACINE (INTUNIV) 4 MG TB24 ER tablet Take 4 mg by mouth daily. Pt takes a 2mg51mblet and 4mg 47mlet together for a total dose of 74mg. 90m. Insulin Pen Needle 31G X 6 MM MISC 1 Device by Does not apply route daily. 100 each 3  . Insulin Syringe-Needle U-100  (RELION INSULIN SYRINGE 1ML/31G) 31G X 5/16" 1 ML MISC 1 each by Does not apply route 2 (two) times daily. Use to inject insulin twice daily 90 each 2  . sertraline (ZOLOFT) 50 MG tablet Take 50 mg by mouth daily.     No current facility-administered medications on file prior to visit.     Allergies  Allergen Reactions  . Codeine Other (See Comments)    seizures  . Penicillins Other (See Comments)    Seizures  Has patient had a PCN reaction causing immediate rash, facial/tongue/throat swelling, SOB or lightheadedness with hypotension: No Has patient had a PCN reaction causing severe rash involving mucus membranes or skin necrosis: No Has patient had a PCN reaction that required hospitalization: Yes Has patient had a PCN reaction occurring within the last 10 years: No If all of the above answers are "NO", then may proceed with Cephalosporin use.   . Promethazine Hcl Other (See Comments)    Seizures.    Family History  Problem Relation Age of Onset  . Asthma Mother   . Diabetes Neg Hx     BP 108/70 (BP Location: Left Arm, Patient Position: Sitting, Cuff Size: Normal)   Pulse 91   Ht 5' 3"  (1.6 m)   Wt 173 lb (78.5 kg)   SpO2 97%   BMI 30.65 kg/m    Review of Systems Denies LOC    Objective:   Physical Exam VITAL SIGNS:  See vs page GENERAL: no distress Pulses: dorsalis pedis intact bilat.   MSK: no deformity of the feet CV: no leg edema Skin:  no ulcer on the feet.  normal color and temp on the feet. Neuro: sensation is intact to touch on the feet.    Lab Results  Component Value Date   HGBA1C 10.1 (A) 10/21/2018       Assessment & Plan:  type 1 DM: poor glycemic control: he declines to change to qd insulin.   Patient Instructions  check your blood sugar twice a day.  vary the time of day when you check, between before the 3 meals, and at bedtime.  also check if you have symptoms of your blood sugar being too high or too low.  please keep a record of the  readings and bring it to your next appointment here (or you can bring the meter itself).  You can write it on any  piece of paper.  please call us sooner if your blood sugar goes below 70, or if you have a lot of readings over 200. please change the 70/30 insulin, 45 units with breakfast, and 30 with supper.  I have sent a prescription to your pharmacy. Please come back for a follow-up appointment in 2 months.

## 2018-10-24 DIAGNOSIS — E119 Type 2 diabetes mellitus without complications: Secondary | ICD-10-CM | POA: Insufficient documentation

## 2018-11-19 ENCOUNTER — Other Ambulatory Visit: Payer: Self-pay | Admitting: Endocrinology

## 2018-12-24 ENCOUNTER — Ambulatory Visit: Payer: Medicaid Other | Admitting: Endocrinology

## 2020-02-09 DIAGNOSIS — F321 Major depressive disorder, single episode, moderate: Secondary | ICD-10-CM | POA: Insufficient documentation

## 2020-07-15 ENCOUNTER — Other Ambulatory Visit: Payer: Self-pay

## 2020-07-15 ENCOUNTER — Emergency Department (HOSPITAL_COMMUNITY)
Admission: EM | Admit: 2020-07-15 | Discharge: 2020-07-15 | Disposition: A | Discharge status: Home / Self Care | Payer: BLUE CROSS/BLUE SHIELD | Attending: Emergency Medicine | Admitting: Emergency Medicine

## 2020-07-15 ENCOUNTER — Encounter (HOSPITAL_COMMUNITY): Payer: Self-pay

## 2020-07-15 DIAGNOSIS — E1165 Type 2 diabetes mellitus with hyperglycemia: Secondary | ICD-10-CM | POA: Insufficient documentation

## 2020-07-15 DIAGNOSIS — Z794 Long term (current) use of insulin: Secondary | ICD-10-CM | POA: Diagnosis not present

## 2020-07-15 DIAGNOSIS — Z87891 Personal history of nicotine dependence: Secondary | ICD-10-CM | POA: Diagnosis not present

## 2020-07-15 DIAGNOSIS — Z85830 Personal history of malignant neoplasm of bone: Secondary | ICD-10-CM | POA: Diagnosis not present

## 2020-07-15 DIAGNOSIS — R739 Hyperglycemia, unspecified: Secondary | ICD-10-CM

## 2020-07-15 LAB — BASIC METABOLIC PANEL
Anion gap: 11 (ref 5–15)
BUN: 12 mg/dL (ref 6–20)
CO2: 23 mmol/L (ref 22–32)
Calcium: 9.8 mg/dL (ref 8.9–10.3)
Chloride: 102 mmol/L (ref 98–111)
Creatinine, Ser: 0.72 mg/dL (ref 0.61–1.24)
GFR, Estimated: >60 mL/min (ref >60)
Glucose, Bld: 290 mg/dL — ABNORMAL HIGH (ref 70–99)
Potassium: 3.5 mmol/L (ref 3.5–5.1)
Sodium: 136 mmol/L (ref 135–145)

## 2020-07-15 LAB — CBC
HCT: 46.4 % (ref 39.0–52.0)
Hemoglobin: 16.7 g/dL (ref 13.0–17.0)
MCH: 29 pg (ref 26.0–34.0)
MCHC: 36 g/dL (ref 30.0–36.0)
MCV: 80.7 fL (ref 80.0–100.0)
Platelets: 337 10*3/uL (ref 150–400)
RBC: 5.75 MIL/uL (ref 4.22–5.81)
RDW: 12.6 % (ref 11.5–15.5)
WBC: 8.2 10*3/uL (ref 4.0–10.5)
nRBC: 0 % (ref 0.0–0.2)

## 2020-07-15 LAB — BLOOD GAS, VENOUS
Acid-Base Excess: 0 mmol/L (ref 0.0–2.0)
Bicarbonate: 24.8 mmol/L (ref 20.0–28.0)
O2 Saturation: 87.8 %
Patient temperature: 98.6
pCO2, Ven: 42.7 mmHg — ABNORMAL LOW (ref 44.0–60.0)
pH, Ven: 7.382 (ref 7.250–7.430)
pO2, Ven: 54.2 mmHg — ABNORMAL HIGH (ref 32.0–45.0)

## 2020-07-15 LAB — CBG MONITORING, ED: Glucose-Capillary: 250 mg/dL — ABNORMAL HIGH (ref 70–99)

## 2020-07-15 LAB — BETA-HYDROXYBUTYRIC ACID: Beta-Hydroxybutyric Acid: 0.49 mmol/L — ABNORMAL HIGH (ref 0.05–0.27)

## 2020-07-15 MED ORDER — IBUPROFEN 800 MG PO TABS
800.0000 mg | ORAL_TABLET | Freq: Once | ORAL | Status: AC
  Administered 2020-07-15: 800 mg via ORAL
  Filled 2020-07-15: qty 1

## 2020-07-15 MED ORDER — INSULIN ASPART 100 UNIT/ML ~~LOC~~ SOLN
8.0000 [IU] | Freq: Once | SUBCUTANEOUS | Status: AC
  Administered 2020-07-15: 8 [IU] via SUBCUTANEOUS
  Filled 2020-07-15: qty 0.08

## 2020-07-15 MED ORDER — ONDANSETRON HCL 4 MG/2ML IJ SOLN: 4.0000 mg | Freq: Once | INTRAMUSCULAR | Status: DC

## 2020-07-15 MED ORDER — ONDANSETRON 4 MG PO TBDP
4.0000 mg | ORAL_TABLET | Freq: Once | ORAL | Status: AC
  Administered 2020-07-15: 4 mg via ORAL
  Filled 2020-07-15: qty 1

## 2020-07-15 NOTE — ED Notes (Signed)
Pt CBG 250 @ triage

## 2020-07-15 NOTE — ED Provider Notes (Signed)
Patient seen for medical screening exam in triage area.  Chief Complaint: Hyperglycemia  HPI:   Patient endorses 3 days of elevated blood glucose, endorses associated fatigue, and generalized weakness, nausea and headache x4 days.    ROS: Headache, nausea, fatigue. NEGATIVE for abdominal pain, chest pain, shortness of breath, palpitations.  Physical Exam:   Gen: No distress  Neuro: Awake and Alert  Abdom: Soft, nondistended, nontender  Skin: Warm    Focused Exam: Cardiopulmonary exam is normal, abdominal exam is benign.  Vital signs are normal.   Initiation of care has begun. The patient has been counseled on the process, plan, and necessity for staying for the completion/evaluation, and the remainder of the medical screening examination  MSE was initiated and I personally evaluated the patient and placed orders (if any) at  5:05 PM on July 15, 2020.  The patient appears stable so that the remainder of the MSE may be completed by another provider.   Aura Dials 07/15/20 1708    Lacretia Leigh, MD 07/18/20 0900

## 2020-07-15 NOTE — ED Provider Notes (Signed)
[facility] DEPT Provider Note   CSN: [id number] Arrival date & time: 07/15/20  1618     History Chief Complaint  Patient presents with  . Hyperglycemia    Danny Werner is a 25 y.o. male.  25 year old male with history of type 2 diabetes presents with hyperglycemia.  Patient states that he has been compliant with his insulin.  Did have a recent change to his diabetic regimen.  Denies any emesis or fever.  No weakness noted.  Also had recent dietary changes.        Past Medical History:  Diagnosis Date  . ADHD (attention deficit hyperactivity disorder)   . Anxiety   . 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 Depression   . Depression   . Diabetes mellitus without complication ([place])   . Intermittent explosive disorder   . Migraines   . Trauma     Patient Active Problem List   Diagnosis Date Noted  . Diabetes (Danny Werner) 10/24/2018  . Intermittent explosive disorder 12/24/2017  . 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 Abdominal pain 10/06/2017  . Acute urinary retention 10/06/2017  . ADHD 10/06/2017  . Weight loss 10/06/2017  . Pathologic fx humerus 09/11/2012  . 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 ADENOIDECTOMY    . ANAL RECTAL MANOMETRY N/A 01/18/2016   Procedure: ANO RECTAL MANOMETRY;  Surgeon: Arta Silence, MD;  Location: [facility];  Service: Endoscopy;  Laterality: N/A;  . FRACTURE SURGERY    . MOUTH SURGERY    . TONSILLECTOMY         Family History  Problem Relation Age of Onset  . Asthma Mother   . Diabetes Neg Hx     Social History   Tobacco Use  . Smoking status: Former Research scientist (life sciences)  . Smokeless tobacco: Never Used  Vaping Use  . Vaping Use: Never used  Substance Use Topics  . Alcohol use: No  . Drug use: No    Home Medications Prior to Admission medications   Medication Sig Start Date End Date Taking? Authorizing Provider   acetaminophen (TYLENOL) 500 MG tablet Take 500 mg by mouth every 6 (six) hours as needed for headache.    [provider]  ARIPiprazole (ABILIFY) 10 MG tablet Take 10 mg by mouth daily.    [provider]  blood glucose meter kit and supplies KIT Dispense based on patient and insurance preference. Use up to four times daily as directed. (FOR ICD-9 250.00, 250.01). 10/07/17   Aline August, MD  Blood Glucose Monitoring Suppl (ACCU-CHEK AVIVA PLUS) w/Device KIT Use to monitor glucose levels up to 4 times per day 03/10/18   Renato Shin, MD  guanFACINE (INTUNIV) 2 MG TB24 ER tablet Take 2 mg by mouth daily. Pt takes a 30m tablet and 447mtablet together for a total dose of 16m1m   [provider]  guanFACINE (INTUNIV) 4 MG TB24 ER tablet Take 4 mg by mouth daily. Pt takes a 2mg69mblet and 4mg 75mlet together for a total dose of 16mg. 27mProvider, Historical, MD  insulin NPH-regular Human (NOVOLIN 70/30) (70-30) 100 UNIT/ML injection 45 units with breakfast, and 30 with supper. 10/21/18   EllisoRenato ShinInsulin Pen Needle 31G X 6 MM MISC 1 Device by Does not apply route daily. 02/24/18   EllisoRenato ShinRELIONPl[place]  1ML/31G 31G X 5/16" 1 ML MISC USE TO INJECT INSULIN TWICE DAILY 11/19/18   Renato Shin, MD  sertraline (ZOLOFT) 50 MG tablet Take 50 mg by mouth daily.    [provider]    Allergies    Codeine, Penicillins, and Promethazine hcl  Review of Systems   Review of Systems  All other systems reviewed and are negative.   Physical Exam Updated Vital Signs BP 131/81   Pulse 71   Temp 98.5 F (36.9 C) (Oral)   Resp 20   Ht 1.6 m (5' 3" )   Wt 72.6 kg   SpO2 100%   BMI 28.34 kg/m   Physical Exam Vitals and nursing note reviewed.  Constitutional:      General: He is not in acute distress.    Appearance: Normal appearance. He is well-developed. He is not toxic-appearing.  HENT:     Head: Normocephalic and atraumatic.  Eyes:      General: Lids are normal.     Conjunctiva/sclera: Conjunctivae normal.     Pupils: Pupils are equal, round, and reactive to light.  Neck:     Thyroid: No thyroid mass.     Trachea: No tracheal deviation.  Cardiovascular:     Rate and Rhythm: Normal rate and regular rhythm.     Heart sounds: Normal heart sounds. No murmur heard. No gallop.   Pulmonary:     Effort: Pulmonary effort is normal. No respiratory distress.     Breath sounds: Normal breath sounds. No stridor. No decreased breath sounds, wheezing, rhonchi or rales.  Abdominal:     General: Bowel sounds are normal. There is no distension.     Palpations: Abdomen is soft.     Tenderness: There is no abdominal tenderness. There is no rebound.  Musculoskeletal:        General: No tenderness. Normal range of motion.     Cervical back: Normal range of motion and neck supple.  Skin:    General: Skin is warm and dry.     Findings: No abrasion or rash.  Neurological:     Mental Status: He is alert and oriented to person, place, and time.     GCS: GCS eye subscore is 4. GCS verbal subscore is 5. GCS motor subscore is 6.     Cranial Nerves: No cranial nerve deficit.     Sensory: No sensory deficit.  Psychiatric:        Speech: Speech normal.        Behavior: Behavior normal.     ED Results / Procedures / Treatments   Labs (all labs ordered are listed, but only abnormal results are displayed) Labs Reviewed  BASIC METABOLIC PANEL - Abnormal; Notable for the following components:      Result Value   Glucose, Bld 290 (*)    All other components within normal limits  BLOOD GAS, VENOUS - Abnormal; Notable for the following components:   pCO2, Ven 42.7 (*)    pO2, Ven 54.2 (*)    All other components within normal limits  CBG MONITORING, ED - Abnormal; Notable for the following components:   Glucose-Capillary 250 (*)    All other components within normal limits  CBC  URINALYSIS, ROUTINE W REFLEX MICROSCOPIC   BETA-HYDROXYBUTYRIC ACID  CBG MONITORING, ED    EKG EKG Interpretation  Date/Time:  Friday July 15 2020 17:07:15 EDT Ventricular Rate:  91 PR Interval:  123 QRS Duration: 84 QT Interval:  334 QTC Calculation: 411 R  Axis:   57 Text Interpretation: Sinus rhythm Nonspecific T abnormalities, diffuse leads Borderline ST elevation, anterolateral leads 12 Lead; [place] Confirmed by Lacretia Leigh ([id number]) on 07/15/2020 6:43:52 PM   Radiology No results found.  Procedures Procedures   Medications Ordered in ED Medications  insulin aspart (novoLOG) injection 8 Units (has no administration in time range)  ondansetron (ZOFRAN-ODT) disintegrating tablet 4 mg (4 mg Oral Given 07/15/20 1712)  ibuprofen (ADVIL) tablet 800 mg (800 mg Oral Given 07/15/20 1712)    ED Course  I have reviewed the triage vital signs and the nursing notes.  Pertinent labs & imaging results that were available during my care of the patient were reviewed by me and considered in my medical decision making (see chart for details).    MDM Rules/Calculators/A&P                          Patient is and Is normal.  Blood sugar noted and given insulin subcu for this.  Will need to follow-up with endocrinologist Final Clinical Impression(s) / ED Diagnoses Final diagnoses:  None    Rx / DC Orders ED Discharge Orders    None       Lacretia Leigh, MD 07/15/20 1902

## 2020-07-15 NOTE — ED Triage Notes (Signed)
Pt reports his CBG has been reading in 300s today. Pt reports taking insulin today. Pt endorses generalized weakness, nausea, and headache that began today.

## 2020-08-24 DIAGNOSIS — F99 Mental disorder, not otherwise specified: Secondary | ICD-10-CM | POA: Insufficient documentation

## 2020-08-24 DIAGNOSIS — F5105 Insomnia due to other mental disorder: Secondary | ICD-10-CM | POA: Insufficient documentation

## 2020-12-11 ENCOUNTER — Encounter (HOSPITAL_COMMUNITY): Payer: Self-pay

## 2020-12-11 ENCOUNTER — Emergency Department (HOSPITAL_COMMUNITY)
Admission: EM | Admit: 2020-12-11 | Discharge: 2020-12-11 | Disposition: A | Discharge status: Home / Self Care | Payer: BLUE CROSS/BLUE SHIELD | Attending: Emergency Medicine | Admitting: Emergency Medicine

## 2020-12-11 ENCOUNTER — Other Ambulatory Visit: Payer: Self-pay

## 2020-12-11 DIAGNOSIS — E1165 Type 2 diabetes mellitus with hyperglycemia: Secondary | ICD-10-CM | POA: Diagnosis present

## 2020-12-11 DIAGNOSIS — Z87891 Personal history of nicotine dependence: Secondary | ICD-10-CM | POA: Insufficient documentation

## 2020-12-11 DIAGNOSIS — R739 Hyperglycemia, unspecified: Secondary | ICD-10-CM

## 2020-12-11 DIAGNOSIS — Z794 Long term (current) use of insulin: Secondary | ICD-10-CM | POA: Insufficient documentation

## 2020-12-11 LAB — ACETAMINOPHEN LEVEL: Acetaminophen (Tylenol), Serum: <10 ug/mL — ABNORMAL LOW (ref 10–30)

## 2020-12-11 LAB — CBC WITH DIFFERENTIAL/PLATELET
Abs Immature Granulocytes: 0.06 10*3/uL (ref 0.00–0.07)
Basophils Absolute: 0.1 10*3/uL (ref 0.0–0.1)
Basophils Relative: 1 %
Eosinophils Absolute: 0.1 10*3/uL (ref 0.0–0.5)
Eosinophils Relative: 1 %
HCT: 47.4 % (ref 39.0–52.0)
Hemoglobin: 17.1 g/dL — ABNORMAL HIGH (ref 13.0–17.0)
Immature Granulocytes: 1 %
Lymphocytes Relative: 30 %
Lymphs Abs: 2.3 10*3/uL (ref 0.7–4.0)
MCH: 29.4 pg (ref 26.0–34.0)
MCHC: 36.1 g/dL — ABNORMAL HIGH (ref 30.0–36.0)
MCV: 81.4 fL (ref 80.0–100.0)
Monocytes Absolute: 0.5 10*3/uL (ref 0.1–1.0)
Monocytes Relative: 6 %
Neutro Abs: 4.8 10*3/uL (ref 1.7–7.7)
Neutrophils Relative %: 61 %
Platelets: 295 10*3/uL (ref 150–400)
RBC: 5.82 MIL/uL — ABNORMAL HIGH (ref 4.22–5.81)
RDW: 12.1 % (ref 11.5–15.5)
WBC: 7.8 10*3/uL (ref 4.0–10.5)
nRBC: 0 % (ref 0.0–0.2)

## 2020-12-11 LAB — COMPREHENSIVE METABOLIC PANEL
ALT: 22 U/L (ref 0–44)
AST: 18 U/L (ref 15–41)
Albumin: 4.9 g/dL (ref 3.5–5.0)
Alkaline Phosphatase: 160 U/L — ABNORMAL HIGH (ref 38–126)
Anion gap: 10 (ref 5–15)
BUN: 20 mg/dL (ref 6–20)
CO2: 26 mmol/L (ref 22–32)
Calcium: 9.5 mg/dL (ref 8.9–10.3)
Chloride: 97 mmol/L — ABNORMAL LOW (ref 98–111)
Creatinine, Ser: 0.98 mg/dL (ref 0.61–1.24)
GFR, Estimated: >60 mL/min (ref >60)
Glucose, Bld: 415 mg/dL — ABNORMAL HIGH (ref 70–99)
Potassium: 4.5 mmol/L (ref 3.5–5.1)
Sodium: 133 mmol/L — ABNORMAL LOW (ref 135–145)
Total Bilirubin: 1.6 mg/dL — ABNORMAL HIGH (ref 0.3–1.2)
Total Protein: 8.1 g/dL (ref 6.5–8.1)

## 2020-12-11 LAB — BLOOD GAS, VENOUS
Acid-base deficit: 1.7 mmol/L (ref 0.0–2.0)
Bicarbonate: 22.3 mmol/L (ref 20.0–28.0)
O2 Saturation: 99.4 %
Patient temperature: 98.6
pCO2, Ven: 37.7 mmHg — ABNORMAL LOW (ref 44.0–60.0)
pH, Ven: 7.389 (ref 7.250–7.430)
pO2, Ven: 153 mmHg — ABNORMAL HIGH (ref 32.0–45.0)

## 2020-12-11 LAB — URINALYSIS, ROUTINE W REFLEX MICROSCOPIC
Bacteria, UA: NONE SEEN
Bilirubin Urine: NEGATIVE
Glucose, UA: >=500 mg/dL — AB
Hgb urine dipstick: NEGATIVE
Ketones, ur: 80 mg/dL — AB
Leukocytes,Ua: NEGATIVE
Nitrite: NEGATIVE
Protein, ur: NEGATIVE mg/dL
Specific Gravity, Urine: 1.028 (ref 1.005–1.030)
pH: 5 (ref 5.0–8.0)

## 2020-12-11 LAB — CBG MONITORING, ED
Glucose-Capillary: 448 mg/dL — ABNORMAL HIGH (ref 70–99)
Glucose-Capillary: 295 mg/dL — ABNORMAL HIGH (ref 70–99)
Glucose-Capillary: 278 mg/dL — ABNORMAL HIGH (ref 70–99)

## 2020-12-11 LAB — BETA-HYDROXYBUTYRIC ACID: Beta-Hydroxybutyric Acid: 1.18 mmol/L — ABNORMAL HIGH (ref 0.05–0.27)

## 2020-12-11 LAB — SALICYLATE LEVEL: Salicylate Lvl: <7 mg/dL — ABNORMAL LOW (ref 7.0–30.0)

## 2020-12-11 MED ORDER — SODIUM CHLORIDE 0.9 % IV BOLUS
1000.0000 mL | Freq: Once | INTRAVENOUS | Status: AC
  Administered 2020-12-11: 1000 mL via INTRAVENOUS

## 2020-12-11 MED ORDER — INSULIN ASPART 100 UNIT/ML IJ SOLN
10.0000 [IU] | Freq: Once | INTRAMUSCULAR | Status: AC
  Administered 2020-12-11: 10 [IU] via SUBCUTANEOUS
  Filled 2020-12-11: qty 0.1

## 2020-12-11 NOTE — ED Provider Notes (Signed)
[facility] DEPT Provider Note   CSN: [id number] Arrival date & time: 12/11/20  1357     History Chief Complaint  Patient presents with   Hyperglycemia    Danny Werner is a 25 y.o. male with PMHx Diabetes, ADHD, Anxiety, and Depression who presents to the ED via EMS for hyperglycemia. Pt reports his blood sugars have been running in the 400-500s at home. He is concerned it could be his Abilify causing increased levels however denies recent changes to this medication or recent addition of same. Per chart review pt was recently seen by his endocrinologist Dr. Burney Gauze on 08/17 for diabetic management - his blood sugar was 302 in the office. He did not bring his Dexcom monitor with him for BG trending. Pt on Trulicity 1.5 mg ONCE WEEKLY. He was advised to stop [place] daily and start 70/30 mixed insulin daily (40 U in the AM and 20 U with dinner). Pt reports he has been compliant with same however his sugars have been elevated. Blood glucose 538 with EMS. Pt reports he has increased thirst and increased urination. Denies abdominal pain, nausea, vomiting, diarrhea, SOB, or any other associated symptoms.   Per triage report family had mentioned suicidal ideation however pt denies this. He reports a family friend recently committed suicide and his mom has been increasingly worried that he might do the same. Pt is adamant that he is not suicidal and does not think about harming himself as he wants to live.   The history is provided by the patient and medical records.      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   Abdominal pain 10/06/2017   Acute urinary retention 10/06/2017   ADHD 10/06/2017   Weight loss 10/06/2017   Pathologic fx humerus 09/11/2012   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    Past Surgical History:  Procedure Laterality Date   ADENOIDECTOMY     ANAL RECTAL MANOMETRY N/A 01/18/2016   Procedure: ANO RECTAL MANOMETRY;  Surgeon: Arta Silence, MD;  Location: [facility];  Service: Endoscopy;  Laterality: N/A;   FRACTURE SURGERY     MOUTH SURGERY     TONSILLECTOMY         Family History  Problem Relation Age of Onset   Asthma Mother    Diabetes Neg Hx     Social History   Tobacco Use   Smoking status: Former   Smokeless tobacco: Never  Scientific laboratory technician Use: Never used  Substance Use Topics   Alcohol use: No   Drug use: No    Home Medications Prior to Admission medications   Medication Sig Start Date End Date Taking? Authorizing Provider  acetaminophen (TYLENOL) 500 MG tablet Take 500 mg by mouth every 6 (six) hours as needed for headache.    [provider]  ARIPiprazole (ABILIFY) 10 MG tablet Take 10 mg by mouth daily.    [provider]  blood glucose meter kit and supplies  KIT Dispense based on patient and insurance preference. Use up to four times daily as directed. (FOR ICD-9 250.00, 250.01). 10/07/17   Aline August, MD  Blood Glucose Monitoring Suppl (ACCU-CHEK AVIVA PLUS) w/Device KIT Use to monitor glucose levels up to 4 times per day 03/10/18   Renato Shin, MD  guanFACINE (INTUNIV) 2 MG TB24 ER tablet Take 2 mg by mouth daily. Pt takes a 233m tablet and 433mtablet together for a total dose of 33m73m   [provider]  guanFACINE (INTUNIV) 4 MG TB24 ER tablet Take 4 mg by mouth daily. Pt takes a 2mg77mblet and 4mg 71mlet together for a total dose of 33mg. 64mProvider, Historical, MD  insulin NPH-regular Human (NOVOLIN 70/30) (70-30) 100 UNIT/ML  injection 45 units with breakfast, and 30 with supper. 10/21/18   EllisoRenato ShinInsulin Pen Needle 31G X 6 MM MISC 1 Device by Does not apply route daily. 02/24/18   EllisoRenato ShinRELION INSULIN SYRINGE 1ML/31G 31G X 5/16" 1 ML MISC USE TO INJECT INSULIN TWICE DAILY 11/19/18   EllisoRenato Shinsertraline (ZOLOFT) 50 MG tablet Take 50 mg by mouth daily.    [provider]    Allergies    Codeine, Penicillins, and Promethazine hcl  Review of Systems   Review of Systems  Constitutional:  Negative for chills and fever.  Respiratory:  Negative for shortness of breath.   Gastrointestinal:  Negative for abdominal pain, nausea and vomiting.  Endocrine: Positive for polydipsia and polyuria.  All other systems reviewed and are negative.  Physical Exam Updated Vital Signs BP 112/61   Pulse (!) 55   Temp 98.7 F (37.1 C) (Oral)   Resp 14   SpO2 99%   Physical Exam Vitals and nursing note reviewed.  Constitutional:      Appearance: He is not ill-appearing or diaphoretic.  HENT:     Head: Normocephalic and atraumatic.  Eyes:     Conjunctiva/sclera: Conjunctivae normal.  Cardiovascular:     Rate and Rhythm: Normal rate and regular rhythm.     Pulses: Normal pulses.  Pulmonary:     Effort: Pulmonary effort is normal.     Breath sounds: Normal breath sounds. No wheezing, rhonchi or rales.  Abdominal:     Palpations: Abdomen is soft.     Tenderness: There is no abdominal tenderness. There is no guarding or rebound.  Musculoskeletal:     Cervical back: Neck supple.  Skin:    General: Skin is warm and dry.  Neurological:     Mental Status: He is alert.    ED Results / Procedures / Treatments   Labs (all labs ordered are listed, but only abnormal results are displayed) Labs Reviewed  URINALYSIS, ROUTINE W REFLEX MICROSCOPIC - Abnormal; Notable for the following components:      Result Value   Color, Urine STRAW (*)    Glucose, UA >=500 (*)    Ketones, ur 80 (*)     All other components within normal limits  CBC WITH DIFFERENTIAL/PLATELET - Abnormal; Notable for the following components:   RBC 5.82 (*)    Hemoglobin 17.1 (*)    MCHC 36.1 (*)    All other components within normal limits  COMPREHENSIVE METABOLIC PANEL - Abnormal; Notable for the following components:   Sodium 133 (*)    Chloride 97 (*)    Glucose, Bld 415 (*)    Alkaline Phosphatase 160 (*)  Total Bilirubin 1.6 (*)    All other components within normal limits  BLOOD GAS, VENOUS - Abnormal; Notable for the following components:   pCO2, Ven 37.7 (*)    pO2, Ven 153.0 (*)    All other components within normal limits  ACETAMINOPHEN LEVEL - Abnormal; Notable for the following components:   Acetaminophen (Tylenol), Serum <10 (*)    All other components within normal limits  SALICYLATE LEVEL - Abnormal; Notable for the following components:   Salicylate Lvl <3.0 (*)    All other components within normal limits  BETA-HYDROXYBUTYRIC ACID - Abnormal; Notable for the following components:   Beta-Hydroxybutyric Acid 1.18 (*)    All other components within normal limits  CBG MONITORING, ED - Abnormal; Notable for the following components:   Glucose-Capillary 448 (*)    All other components within normal limits  CBG MONITORING, ED - Abnormal; Notable for the following components:   Glucose-Capillary 295 (*)    All other components within normal limits  CBG MONITORING, ED - Abnormal; Notable for the following components:   Glucose-Capillary 278 (*)    All other components within normal limits    EKG EKG Interpretation  Date/Time:  _51  year old male who presents to the ED today for hyperglycemia with a blood glucose level of 538 with EMS.  He has been compliant with his Trulicity 1.5 mg once weekly as well as his insulin 70/30.  I will to the ED today vitals are stable and patient appears to be no acute distress.  He has no abdominal tenderness palpation on exam.  Denies any nausea, vomiting.  Does not appear to be in DKA at this time however we will plan for labs to assess for same.  Will provide fluids at this time.  Was some concern about possible suicidal ideation per family with EMS however patient that he mentally denies this today.  He is able to contract for safety.  Do not feel he requires TTS eval.   I, Tzippy Testerman [employer], personally reviewed and evaluated these images and lab results as part of my medical decision-making.  CBC without leukocytosis. RBC and HGB elevated, suspect in the setting of dehydration. Fluids running.  CMP with glucose 415. Bicarb WNL at 26. No Gap. Potassium 4.5. Will provide 10 units subcue insulin.  U/A without signs of infection.  VBG with normal pH 7.389. No signs of DKA at this time.  Beta hydroxybutryic acid slightly elevated at 1.18  Repeat CBG  295  On reevaluation pt is resting comfortably. Family at beside reports they do not believe he is taking his 70/30. Pt admits to same at this time as he reports it causes diarrhea. He  has only been taking the Trulicity on Saturdays. I had lengthy discussion with him regarding the fact that his sugars will continue to stay elevated if he does not take his medications as prescribed. I recommend following up with endocrinology this week to discuss side effects of the medications/other options. Pt stable for discharge home.   This note was prepared using Dragon voice recognition software and may include unintentional dictation errors due to the inherent limitations of voice recognition software.   Final Clinical Impression(s) / ED Diagnoses Final diagnoses:  Hyperglycemia    Rx / DC Orders ED Discharge Orders     None        Discharge Instructions      Please follow up with your endocrinologist regarding ED visit today and to discuss other medication options given you are experiencing side effects from the insulin  Return to the ED for any new/worsening symptoms       Eustaquio Maize, PA-C 12/11/20 1830    Truddie Hidden, MD 12/12/20 1032

## 2020-12-11 NOTE — ED Triage Notes (Signed)
Pt BIB EMS from home. Pt CBG 538 today. Pt endorses mild nausea and polyuria. Pt reports being compliant with medication. Family reports SI, but pt denies SI.

## 2020-12-11 NOTE — ED Provider Notes (Signed)
Emergency Medicine Provider Triage Evaluation Note  Danny Werner , a 25 y.o. male  was evaluated in triage.  Pt complains of hyperglycemia. He has been trending up sugars.  He wears a dexcom, his mother has it.  He states that he missed his trulicity two weeks ago and since then his sugar has been high.   Denies SI, HI, AVH.   Review of Systems  Positive: Polyuria Negative: Fevers  Physical Exam  BP 132/69 (BP Location: Right Arm)   Pulse 71   Temp 98.7 F (37.1 C) (Oral)   Resp 16   SpO2 100%  Gen:   Awake, no distress   Resp:  Normal effort  MSK:   Moves extremities without difficulty  Other:  Awake and alert.   Medical Decision Making  Medically screening exam initiated at 2:13 PM.  Appropriate orders placed.  SHERRICK SCHAPER was informed that the remainder of the evaluation will be completed by another provider, this initial triage assessment does not replace that evaluation, and the importance of remaining in the ED until their evaluation is complete.    [name], PA-C 12/11/20 [place], Christ[place], DO 12/12/20 [phone]

## 2020-12-11 NOTE — Discharge Instructions (Addendum)
Please follow up with your endocrinologist regarding ED visit today and to discuss other medication options given you are experiencing side effects from the insulin  Return to the ED for any new/worsening symptoms

## 2020-12-12 ENCOUNTER — Ambulatory Visit (HOSPITAL_COMMUNITY)
Admission: EM | Admit: 2020-12-12 | Discharge: 2020-12-12 | Disposition: A | Discharge status: Home / Self Care | Payer: BLUE CROSS/BLUE SHIELD

## 2020-12-12 DIAGNOSIS — F6381 Intermittent explosive disorder: Secondary | ICD-10-CM | POA: Diagnosis not present

## 2020-12-12 NOTE — ED Provider Notes (Signed)
Behavioral Health Urgent Care Medical Screening Exam  Patient Name: Danny Werner MRN: [id number] Date of Evaluation: 12/12/20 Chief Complaint:   Diagnosis:  Final diagnoses:  Intermittent explosive disorder    History of Present illness: Danny Werner is a 25 y.o. male history of intermittent explosive disorder, depression, anxiety presents to [facility] with stepdad for concerns over his "anger".  Patient is calm and not in acute distress.  Patient states he had multiple verbal arguments with grandfather who he lives with that has escalated throughout the weekend.  Presently lives with grandparents.  Patient attributes some of his anger due to his hyperglycemia which has been poorly controlled as well as dehydration from working in the sun yesterday.  Patient was at the ED yesterday for hyperglycemia with a blood glucose of 538.  Patient sees an endocrinologist outpatient.  Patient states that he has been having problems with his anger since seeing his new psychiatrist just over a year ago.  Patient states that his psychiatric medications may not be appropriately controlling for his anger but does help with his depression.  Patient states he has not seen a therapist in 6 to 7 months because the therapist was "weird".  Patient is agreeable to seeing an outpatient therapist and believes that this wound help with his anger as he has benefited from therapy when he was younger.  Patient states that his mother is scheduling him for an earlier outpatient psychiatry appointment for medication adjustments for recent anger.  Patient denies present SI/HI/AVH.  Stepdad had no safety concerns and patient able to contract for safety.   Psychiatric Specialty Exam  Presentation  General Appearance:Appropriate for Environment Eye Contact:Good Speech:Normal Rate; Clear and Coherent Speech Volume:Normal Handedness:No data recorded  Mood and Affect  Mood: Euthymic Affect: Appropriate; Congruent  Thought  Process  Thought Processes: Goal Directed; Coherent; Linear Descriptions of Associations:Intact Orientation:Full (Time, Place and Person) Thought Content:Logical   Hallucinations:None Ideas of Reference:None Suicidal Thoughts:No Homicidal Thoughts:No  Sensorium  Memory: Immediate Good; Recent Good; Remote Good Judgment: Intact Insight: Good  Executive Functions  Concentration: Fair Attention Span: Fair Recall: Good Fund of Knowledge: Fair Language: Good  Psychomotor Activity  Psychomotor Activity: Normal  Assets  Assets: Communication Skills; Desire for Improvement; Housing; Social Support; Physical Health  Sleep  Sleep: Good Number of hours:  No data recorded  No data recorded  Physical Exam: Physical Exam Vitals and nursing note reviewed.  Constitutional:      Appearance: He is well-developed.  HENT:     Head: Normocephalic and atraumatic.  Eyes:     Conjunctiva/sclera: Conjunctivae normal.  Cardiovascular:     Rate and Rhythm: Normal rate and regular rhythm.     Heart sounds: No murmur heard. Pulmonary:     Effort: Pulmonary effort is normal. No respiratory distress.     Breath sounds: Normal breath sounds.  Abdominal:     Palpations: Abdomen is soft.     Tenderness: There is no abdominal tenderness.  Musculoskeletal:     Cervical back: Neck supple.  Skin:    General: Skin is warm and dry.  Neurological:     Mental Status: He is alert.   Review of Systems  Constitutional:  Negative for chills and fever.  Respiratory:  Negative for cough, shortness of breath and wheezing.   Cardiovascular:  Negative for chest pain and palpitations.  Gastrointestinal:  Negative for abdominal pain, nausea and vomiting.  Skin:  Negative for itching and rash.  Neurological:  Negative for  dizziness and headaches.  Blood pressure (!) 115/56, pulse 61, temperature 98.3 F (36.8 C), temperature source Oral, resp. rate 18, SpO2 99 %. There is no height or weight  on file to calculate BMI.  Musculoskeletal: Strength & Muscle Tone: within normal limits Gait & Station: normal Patient leans: [place] MSE Discharge Disposition for Follow up and Recommendations: Based on my evaluation the patient does not appear to have an emergency medical condition and can be discharged with resources and follow up care in outpatient services for Medication Management and Individual Therapy  Patient safe to discharge and able to contract for safety.  No safety concerns at this time and he does not meet inpatient or FBC criteria.  France Ravens, MD 12/12/2020, 10:49 AM

## 2020-12-12 NOTE — Progress Notes (Signed)
   12/12/20 0923  [place] (Walk-ins at [facility] only)  What Is the Reason for Your Visit/Call Today? Patient step-dad Ysidro Evert reports patient was suicidal yesterday with a plan to wreck his car. 25 year old patient denying suicidal/homicidal ideations, auditory/visual hallucinations reporting he's here for his anger. Step dad reports patient medication was changed 8-9 months ago. Patient does acknowledge that his new medication does not work. Patient prescribed medication Guanfacine '4mg'$ , Topiramate '25mg'$  and Aripiprazole '10mg'$ . Patient report he had a mental breakdown Friday night 12/10/2020 triggered by his grandfather threatening to kick him out of the house. Patient left the house turned off his locations on his phone and refused to answer his phone. Patient has a strainied relationship with his grandfather. Patient has a history of sugar.  How Long Has This Been Causing You Problems? > than 6 months  Have You Recently Had Any Thoughts About Hurting Yourself? No  Are You Planning to Commit Suicide/Harm Yourself At This time? No  Have you Recently Had Thoughts About [place]? No  Are You Planning To Harm Someone At This Time? No  Have You Used Any Alcohol or Drugs in the Past 24 Hours? No  Do you have any current medical co-morbidities that require immediate attention? No  Clinician description of patient physical appearance/behavior: Patient dressed appropriately for the weather  What Do You Feel Would Help You the Most Today? Stress Management  If access to [facility] was not available, would you have sought care in the Emergency Department? Yes  Determination of Need Routine (7 days)  Options For Referral Medication Management

## 2020-12-12 NOTE — Progress Notes (Signed)
AVS reviewed with patient and all questions answered.  Patient ambulated independently to lobby without issue.  Discharged in stable condition; no acute distress noted.

## 2020-12-12 NOTE — Progress Notes (Signed)
CSW compiled follow up resources/aftercare organizations for patient by physician request. Contact information and instructions are included in AVS for nurse to provide to patient.   Signed:  Durenda Hurt, MSW, [place], LCASA 12/12/2020 10:13 AM

## 2020-12-12 NOTE — Discharge Instructions (Addendum)
Please schedule an appointment to see your psychiatrist to adjust your medication to help with anger. Please continue your medications as prescribed.  Please schedule an appointment to see a therapist (see attached documents for resources).  Continue to take medications as recommended by psychiatrist.

## 2021-03-01 ENCOUNTER — Emergency Department (HOSPITAL_BASED_OUTPATIENT_CLINIC_OR_DEPARTMENT_OTHER)
Admission: EM | Admit: 2021-03-01 | Discharge: 2021-03-01 | Disposition: A | Discharge status: Home / Self Care | Payer: BLUE CROSS/BLUE SHIELD | Attending: Emergency Medicine | Admitting: Emergency Medicine

## 2021-03-01 ENCOUNTER — Other Ambulatory Visit: Payer: Self-pay

## 2021-03-01 ENCOUNTER — Encounter (HOSPITAL_BASED_OUTPATIENT_CLINIC_OR_DEPARTMENT_OTHER): Payer: Self-pay

## 2021-03-01 DIAGNOSIS — Z7984 Long term (current) use of oral hypoglycemic drugs: Secondary | ICD-10-CM | POA: Insufficient documentation

## 2021-03-01 DIAGNOSIS — R739 Hyperglycemia, unspecified: Secondary | ICD-10-CM | POA: Diagnosis present

## 2021-03-01 DIAGNOSIS — Z85828 Personal history of other malignant neoplasm of skin: Secondary | ICD-10-CM | POA: Diagnosis not present

## 2021-03-01 DIAGNOSIS — Z20822 Contact with and (suspected) exposure to covid-19: Secondary | ICD-10-CM | POA: Insufficient documentation

## 2021-03-01 DIAGNOSIS — Z87891 Personal history of nicotine dependence: Secondary | ICD-10-CM | POA: Diagnosis not present

## 2021-03-01 DIAGNOSIS — E1165 Type 2 diabetes mellitus with hyperglycemia: Secondary | ICD-10-CM | POA: Insufficient documentation

## 2021-03-01 DIAGNOSIS — Z794 Long term (current) use of insulin: Secondary | ICD-10-CM | POA: Insufficient documentation

## 2021-03-01 LAB — I-STAT VENOUS BLOOD GAS, ED
Acid-base deficit: 3 mmol/L — ABNORMAL HIGH (ref 0.0–2.0)
Bicarbonate: 24.4 mmol/L (ref 20.0–28.0)
Calcium, Ion: 1.28 mmol/L (ref 1.15–1.40)
HCT: 47 % (ref 39.0–52.0)
Hemoglobin: 16 g/dL (ref 13.0–17.0)
O2 Saturation: 59 %
Potassium: 3.8 mmol/L (ref 3.5–5.1)
Sodium: 135 mmol/L (ref 135–145)
TCO2: 26 mmol/L (ref 22–32)
pCO2, Ven: 49.2 mmHg (ref 44.0–60.0)
pH, Ven: 7.305 (ref 7.250–7.430)
pO2, Ven: 34 mmHg (ref 32.0–45.0)

## 2021-03-01 LAB — RESP PANEL BY RT-PCR (FLU A&B, COVID) ARPGX2
Influenza A by PCR: NEGATIVE
Influenza B by PCR: NEGATIVE
SARS Coronavirus 2 by RT PCR: NEGATIVE

## 2021-03-01 LAB — CBC WITH DIFFERENTIAL/PLATELET
Abs Immature Granulocytes: 0.08 10*3/uL — ABNORMAL HIGH (ref 0.00–0.07)
Basophils Absolute: 0.1 10*3/uL (ref 0.0–0.1)
Basophils Relative: 1 %
Eosinophils Absolute: 0.2 10*3/uL (ref 0.0–0.5)
Eosinophils Relative: 3 %
HCT: 44.6 % (ref 39.0–52.0)
Hemoglobin: 15.9 g/dL (ref 13.0–17.0)
Immature Granulocytes: 1 %
Lymphocytes Relative: 25 %
Lymphs Abs: 1.6 10*3/uL (ref 0.7–4.0)
MCH: 29.1 pg (ref 26.0–34.0)
MCHC: 35.7 g/dL (ref 30.0–36.0)
MCV: 81.5 fL (ref 80.0–100.0)
Monocytes Absolute: 0.4 10*3/uL (ref 0.1–1.0)
Monocytes Relative: 5 %
Neutro Abs: 4.2 10*3/uL (ref 1.7–7.7)
Neutrophils Relative %: 65 %
Platelets: 271 10*3/uL (ref 150–400)
RBC: 5.47 MIL/uL (ref 4.22–5.81)
RDW: 12.7 % (ref 11.5–15.5)
WBC: 6.5 10*3/uL (ref 4.0–10.5)
nRBC: 0 % (ref 0.0–0.2)

## 2021-03-01 LAB — BASIC METABOLIC PANEL
Anion gap: 11 (ref 5–15)
BUN: 19 mg/dL (ref 6–20)
CO2: 22 mmol/L (ref 22–32)
Calcium: 9.2 mg/dL (ref 8.9–10.3)
Chloride: 99 mmol/L (ref 98–111)
Creatinine, Ser: 0.83 mg/dL (ref 0.61–1.24)
GFR, Estimated: >60 mL/min (ref >60)
Glucose, Bld: 415 mg/dL — ABNORMAL HIGH (ref 70–99)
Potassium: 3.7 mmol/L (ref 3.5–5.1)
Sodium: 132 mmol/L — ABNORMAL LOW (ref 135–145)

## 2021-03-01 LAB — BETA-HYDROXYBUTYRIC ACID: Beta-Hydroxybutyric Acid: 1.74 mmol/L — ABNORMAL HIGH (ref 0.05–0.27)

## 2021-03-01 LAB — CBG MONITORING, ED
Glucose-Capillary: 437 mg/dL — ABNORMAL HIGH (ref 70–99)
Glucose-Capillary: 340 mg/dL — ABNORMAL HIGH (ref 70–99)
Glucose-Capillary: 263 mg/dL — ABNORMAL HIGH (ref 70–99)

## 2021-03-01 MED ORDER — LACTATED RINGERS IV SOLN: INTRAVENOUS | Status: DC

## 2021-03-01 MED ORDER — INSULIN ASPART 100 UNIT/ML IJ SOLN: 0.0000 [IU] | Freq: Three times a day (TID) | INTRAMUSCULAR | Status: DC

## 2021-03-01 MED ORDER — INSULIN ASPART 100 UNIT/ML IJ SOLN
0.0000 [IU] | Freq: Once | INTRAMUSCULAR | Status: AC
Start: 2021-03-01 — End: 2021-03-01
  Administered 2021-03-01: 4 [IU] via SUBCUTANEOUS

## 2021-03-01 MED ORDER — LACTATED RINGERS IV BOLUS
20.0000 mL/kg | Freq: Once | INTRAVENOUS | Status: AC
  Administered 2021-03-01: 1424 mL via INTRAVENOUS

## 2021-03-01 NOTE — ED Provider Notes (Signed)
[facility] EMERGENC[facility] DEPARTMENT Provider Note   CSN: [id number] Arrival date & time: 03/01/21  1012     History Chief Complaint  Patient presents with   Hyperglycemia    Danny Werner is a 25 y.o. male.   Hyperglycemia Associated symptoms: increased thirst and polyuria   Associated symptoms: no abdominal pain, no chest pain, no dysuria, no fever, no shortness of breath and no vomiting    25 year old male with medical history below to include type 2 diabetes presenting from his endocrinologist office for hyperglycemia.  The patient states that he has had roughly 1 day of polyuria and polydipsia.  He denies any infectious symptoms.  He denies any fever, chills, cough, chest pain, shortness of breath, abdominal pain, nausea, vomiting, diarrhea, dysuria.  His blood sugar at his endocrinology office was 550 and he was notably with ketones in his urine.  He took 6 units of short acting insulin while at his endocrinologist office.  He is not yet on an insulin pump.  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    Past Surgical History:  Procedure Laterality Date   ADENOIDECTOMY     ANAL RECTAL MANOMETRY N/A 01/18/2016   Procedure: ANO RECTAL MANOMETRY;  Surgeon: Arta Silence, MD;  Location: [facility];  Service: Endoscopy;  Laterality: N/A;   FRACTURE SURGERY      MOUTH SURGERY     TONSILLECTOMY         Family History  Problem Relation Age of Onset   Asthma Mother    Diabetes Neg Hx     Social History   Tobacco Use   Smoking status: Former   Smokeless tobacco: Never  Scientific laboratory technician Use: Every day  Substance Use Topics   Alcohol use: No   Drug use: No    Home Medications Prior to Admission medications   Medication Sig Start Date End Date Taking? Authorizing Provider  acetaminophen (TYLENOL) 500 MG tablet Take 1,000 mg by mouth every 6 (six) hours as needed for headache.    [provider]  ARIPiprazole (ABILIFY) 10 MG tablet Take 10 mg by mouth at bedtime.    [provider]  blood glucose meter kit and supplies KIT Dispense based on patient and insurance preference. Use up to four times daily as directed. (FOR ICD-9 250.00, 250.01). 10/07/17   Aline August, MD  Blood Glucose Monitoring Suppl (ACCU-CHEK AVIVA PLUS) w/Device KIT Use to monitor glucose levels up to 4 times per day 03/10/18   Renato Shin, MD  EPINEPHrine 0.3 mg/0.3 mL IJ SOAJ injection Inject 0.3 mg into the skin once as needed for anaphylaxis. Inject into the middle of the outer thigh and hold for 3 seconds as needed for severe allergic reaction then call 911 if used. 10/18/20  [provider]  Eszopiclone 3 MG TABS Take 3 mg by mouth at bedtime as needed for sleep. 10/20/20   [provider]  glipiZIDE (GLUCOTROL) 5 MG tablet Take 5 mg by mouth at bedtime. 09/22/20   [provider]  guanFACINE (INTUNIV) 2 MG TB24 ER tablet Take 2 mg by mouth at bedtime. Pt takes a 32m tablet and 479mtablet together for a total dose of 78m37m   [provider]  guanFACINE (INTUNIV) 4 MG TB24 ER tablet Take 4 mg by mouth at bedtime. Pt takes a 2mg68mblet and 4mg 22mlet together for a total dose of 78mg. 28mProvider, Historical, MD  insulin glargine (LANTUS) 100 UNIT/ML injection Inject 0-20 Units into the skin at bedtime. Uses a sliding  scale.    [provider]  Insulin Pen Needle 31G X 6 MM MISC 1 Device by Does not apply route daily. 02/24/18   EllisoRenato ShinRELION INSULIN SYRINGE 1ML/31G 31G X 5/16" 1 ML MISC USE TO INJECT INSULIN TWICE DAILY 11/19/18   EllisoRenato Shinsertraline (ZOLOFT) 50 MG tablet Take 50 mg by mouth at bedtime.    [provider]  topiramate (TOPAMAX) 25 MG tablet Take 25 mg by mouth at bedtime. 12/02/20   [provider]  TRESIBA FLEXTOUCH 200 UNIT/ML FlexTouch Pen Inject 0-15 Units into the skin as needed (For blood sugar greater than 220.). 09/01/20   [provider]  TRULICITY 1.5 MG/0.5JQ/4.9EEInject 0.5 mLs into the skin daily. 11/30/20   [provider]    Allergies    Bee venom, Codeine, Penicillins, and Promethazine hcl  Review of Systems   Review of Systems  Constitutional:  Negative for chills and fever.  HENT:  Negative for ear pain and sore throat.   Eyes:  Negative for pain and visual disturbance.  Respiratory:  Negative for cough and shortness of breath.   Cardiovascular:  Negative for chest pain and palpitations.  Gastrointestinal:  Negative for abdominal pain and vomiting.  Endocrine: Positive for polydipsia and polyuria.  Genitourinary:  Negative for dysuria and hematuria.  Musculoskeletal:  Negative for arthralgias and back pain.  Skin:  Negative for color change and rash.  Neurological:  Negative for seizures and syncope.  All other systems reviewed and are negative.  Physical Exam Updated Vital Signs BP (!) 99/54   Pulse (!) 55   Temp 98.4 F (36.9 C) (Oral)   Resp 18   Ht 5' 3"  (1.6 m)   Wt 71.2 kg   SpO2 100%   BMI 27.81 kg/m   Physical Exam Vitals and nursing note reviewed.  Constitutional:      General: He is not in acute distress.    Appearance: He is well-developed.  HENT:     Head: Normocephalic and atraumatic.  Eyes:     Conjunctiva/sclera: Conjunctivae normal.     Pupils: Pupils are equal, round,  and reactive to light.  Cardiovascular:     Rate and Rhythm: Normal rate and regular rhythm.     Heart sounds: No murmur heard. Pulmonary:     Effort: Pulmonary effort is normal. No respiratory distress.     Breath sounds: Normal breath sounds.  Abdominal:     General: There is no distension.     Palpations: Abdomen is soft.     Tenderness: There is no abdominal tenderness. There is no guarding.  Musculoskeletal:        General: No deformity  or signs of injury.     Cervical back: Normal range of motion and neck supple.  Skin:    General: Skin is warm and dry.     Findings: No lesion or rash.  Neurological:     General: No focal deficit present.     Mental Status: He is alert. Mental status is at baseline.    ED Results / Procedures / Treatments   Labs (all labs ordered are listed, but only abnormal results are displayed) Labs Reviewed  BASIC METABOLIC PANEL - Abnormal; Notable for the following components:      Result Value   Sodium 132 (*)    Glucose, Bld 415 (*)    All other components within normal limits  BETA-HYDROXYBUTYRIC ACID - Abnormal; Notable for the following components:   Beta-Hydroxybutyric Acid 1.74 (*)    All other components within normal limits  CBC WITH DIFFERENTIAL/PLATELET - Abnormal; Notable for the following components:   Abs Immature Granulocytes 0.08 (*)    All other components within normal limits  CBG MONITORING, ED - Abnormal; Notable for the following components:   Glucose-Capillary 437 (*)    All other components within normal limits  CBG MONITORING, ED - Abnormal; Notable for the following components:   Glucose-Capillary 340 (*)    All other components within normal limits  I-STAT VENOUS BLOOD GAS, ED - Abnormal; Notable for the following components:   Acid-base deficit 3.0 (*)    All other components within normal limits  CBG MONITORING, ED - Abnormal; Notable for the following components:   Glucose-Capillary 263 (*)    All other  components within normal limits  RESP PANEL BY RT-PCR (FLU A&B, COVID) [id number]    EKG None  Radiology No results found.  Procedures Procedures   Medications Ordered in ED Medications  lactated ringers bolus 1,424 mL (0 mLs Intravenous Stopped 03/01/21 1254)  insulin aspart (novoLOG) injection 0-5 Units (4 Units Subcutaneous Given 03/01/21 1221)    ED Course  I have reviewed the triage vital signs and the nursing notes.  Pertinent labs & imaging results that were available during my care of the patient were reviewed by me and considered in my medical decision making (see chart for details).    MDM Rules/Calculators/A&P                           25 year old male with medical history above to include type 2 diabetes presenting from his endocrinologist office for hyperglycemia.  The patient states that he has had roughly 1 day of polyuria and polydipsia.  He denies any infectious symptoms.  He denies any fever, chills, cough, chest pain, shortness of breath, abdominal pain, nausea, vomiting, diarrhea, dysuria.  His blood sugar at his endocrinology office was 550 and he was notably with ketones in his urine.  He took 6 units of short acting insulin while at his endocrinologist office.  He is not yet on an insulin pump.  On arrival, the patient was afebrile, hemodynamically stable, not tachycardic or tachypneic, saturating well on room air.  Physical exam generally unremarkable.  Presenting with hyperglycemia to 550.  Will work-up to evaluate for possible DKA.  IV access was obtained and the patient was administered an IV fluid bolus.  The patient's laboratory work-up was significant for hyperglycemia without evidence of DKA.  His VBG revealed a pH of 7.305, bicarbonate of 24.  His BMP revealed a normal anion gap with a bicarbonate of  22, blood glucose 415, anion gap 11.  No leukocytosis.  Following initial IV fluid resuscitation, blood glucose was downtrending to 340.  The patient was  administered 4 units of subcutaneous insulin.  The patient did have an elevated beta hydroxybutyrate to 1.74 but no significant acidosis with a normal anion gap.  He was fluid resuscitated and administered subcutaneous insulin with downtrending improvement in his hyperglycemia.  Overall stable for discharge with continued outpatient management.  Advised follow-up with his endocrinologist.   Final Clinical Impression(s) / ED Diagnoses Final diagnoses:  Hyperglycemia    Rx / DC Orders ED Discharge Orders     None        Regan Lemming, MD 03/01/21 2323

## 2021-03-01 NOTE — ED Triage Notes (Signed)
Pt arrives ambulatory  to ED, sent by his endocrinologist states he was there for a regular check up where his blood sugar was 550 and he had ketones in his urine. Pt reports he is type 2, took 6 units short acting while at endocrinologist. Pt is in process of trying to get set up with a pump, dose not have one at this time. Pt only takes insulin, no oral for control. States "I am on a bunch of bi polar medication too". Pt reports eating a donut and drinking a mountain dew yesterday but states he covered with insulin.

## 2021-03-01 NOTE — Discharge Instructions (Addendum)
Your laboratory work-up was negative for diabetic ketoacidosis.  Your hyperglycemia improved significantly following fluid resuscitation and subcutaneous insulin.  Recommend routine PCP and endocrinology follow-up.

## 2021-03-01 NOTE — ED Notes (Addendum)
Sleeping ,states had a doughnut and a mountain dew  which made his sugar hugh

## 2021-05-07 ENCOUNTER — Ambulatory Visit (HOSPITAL_COMMUNITY)
Admission: EM | Admit: 2021-05-07 | Discharge: 2021-05-08 | Disposition: A | Discharge status: Home / Self Care | Payer: No Payment, Other | Attending: Behavioral Health | Admitting: Behavioral Health

## 2021-05-07 DIAGNOSIS — F3130 Bipolar disorder, current episode depressed, mild or moderate severity, unspecified: Secondary | ICD-10-CM | POA: Insufficient documentation

## 2021-05-07 DIAGNOSIS — F319 Bipolar disorder, unspecified: Secondary | ICD-10-CM | POA: Insufficient documentation

## 2021-05-07 DIAGNOSIS — F6381 Intermittent explosive disorder: Secondary | ICD-10-CM | POA: Insufficient documentation

## 2021-05-07 DIAGNOSIS — F419 Anxiety disorder, unspecified: Secondary | ICD-10-CM | POA: Insufficient documentation

## 2021-05-07 DIAGNOSIS — Z794 Long term (current) use of insulin: Secondary | ICD-10-CM | POA: Insufficient documentation

## 2021-05-07 DIAGNOSIS — Z79899 Other long term (current) drug therapy: Secondary | ICD-10-CM | POA: Insufficient documentation

## 2021-05-07 DIAGNOSIS — F4310 Post-traumatic stress disorder, unspecified: Secondary | ICD-10-CM | POA: Insufficient documentation

## 2021-05-07 DIAGNOSIS — E119 Type 2 diabetes mellitus without complications: Secondary | ICD-10-CM | POA: Insufficient documentation

## 2021-05-07 DIAGNOSIS — F17290 Nicotine dependence, other tobacco product, uncomplicated: Secondary | ICD-10-CM | POA: Insufficient documentation

## 2021-05-07 DIAGNOSIS — Z9641 Presence of insulin pump (external) (internal): Secondary | ICD-10-CM | POA: Insufficient documentation

## 2021-05-07 DIAGNOSIS — F909 Attention-deficit hyperactivity disorder, unspecified type: Secondary | ICD-10-CM | POA: Insufficient documentation

## 2021-05-07 DIAGNOSIS — Z20822 Contact with and (suspected) exposure to covid-19: Secondary | ICD-10-CM | POA: Insufficient documentation

## 2021-05-07 DIAGNOSIS — R45851 Suicidal ideations: Secondary | ICD-10-CM | POA: Insufficient documentation

## 2021-05-07 LAB — CBC WITH DIFFERENTIAL/PLATELET
Abs Immature Granulocytes: 0.12 10*3/uL — ABNORMAL HIGH (ref 0.00–0.07)
Basophils Absolute: 0.1 10*3/uL (ref 0.0–0.1)
Basophils Relative: 1 %
Eosinophils Absolute: 0.1 10*3/uL (ref 0.0–0.5)
Eosinophils Relative: 1 %
HCT: 46.7 % (ref 39.0–52.0)
Hemoglobin: 17.2 g/dL — ABNORMAL HIGH (ref 13.0–17.0)
Immature Granulocytes: 2 %
Lymphocytes Relative: 24 %
Lymphs Abs: 1.6 10*3/uL (ref 0.7–4.0)
MCH: 29.5 pg (ref 26.0–34.0)
MCHC: 36.8 g/dL — ABNORMAL HIGH (ref 30.0–36.0)
MCV: 80.1 fL (ref 80.0–100.0)
Monocytes Absolute: 0.3 10*3/uL (ref 0.1–1.0)
Monocytes Relative: 5 %
Neutro Abs: 4.2 10*3/uL (ref 1.7–7.7)
Neutrophils Relative %: 67 %
Platelets: 283 10*3/uL (ref 150–400)
RBC: 5.83 MIL/uL — ABNORMAL HIGH (ref 4.22–5.81)
RDW: 12 % (ref 11.5–15.5)
WBC: 6.4 10*3/uL (ref 4.0–10.5)
nRBC: 0 % (ref 0.0–0.2)

## 2021-05-07 LAB — COMPREHENSIVE METABOLIC PANEL
ALT: 26 U/L (ref 0–44)
AST: 17 U/L (ref 15–41)
Albumin: 5 g/dL (ref 3.5–5.0)
Alkaline Phosphatase: 144 U/L — ABNORMAL HIGH (ref 38–126)
Anion gap: 10 (ref 5–15)
BUN: 13 mg/dL (ref 6–20)
CO2: 21 mmol/L — ABNORMAL LOW (ref 22–32)
Calcium: 9.5 mg/dL (ref 8.9–10.3)
Chloride: 102 mmol/L (ref 98–111)
Creatinine, Ser: 0.86 mg/dL (ref 0.61–1.24)
GFR, Estimated: >60 mL/min (ref >60)
Glucose, Bld: 279 mg/dL — ABNORMAL HIGH (ref 70–99)
Potassium: 3.9 mmol/L (ref 3.5–5.1)
Sodium: 133 mmol/L — ABNORMAL LOW (ref 135–145)
Total Bilirubin: 1 mg/dL (ref 0.3–1.2)
Total Protein: 7.7 g/dL (ref 6.5–8.1)

## 2021-05-07 LAB — POCT URINE DRUG SCREEN - MANUAL ENTRY (I-SCREEN)
POC Amphetamine UR: NOT DETECTED
POC Buprenorphine (BUP): NOT DETECTED
POC Cocaine UR: NOT DETECTED
POC Marijuana UR: NOT DETECTED
POC Methadone UR: NOT DETECTED
POC Methamphetamine UR: NOT DETECTED
POC Morphine: NOT DETECTED
POC Oxazepam (BZO): NOT DETECTED
POC Oxycodone UR: NOT DETECTED
POC Secobarbital (BAR): NOT DETECTED

## 2021-05-07 LAB — GLUCOSE, CAPILLARY
Glucose-Capillary: 310 mg/dL — ABNORMAL HIGH (ref 70–99)
Glucose-Capillary: 408 mg/dL — ABNORMAL HIGH (ref 70–99)
Glucose-Capillary: 431 mg/dL — ABNORMAL HIGH (ref 70–99)
Glucose-Capillary: 386 mg/dL — ABNORMAL HIGH (ref 70–99)
Glucose-Capillary: 444 mg/dL — ABNORMAL HIGH (ref 70–99)
Glucose-Capillary: 229 mg/dL — ABNORMAL HIGH (ref 70–99)

## 2021-05-07 LAB — LIPID PANEL
Cholesterol: 190 mg/dL (ref 0–200)
HDL: 27 mg/dL — ABNORMAL LOW (ref >40)
LDL Cholesterol: 95 mg/dL (ref 0–99)
Total CHOL/HDL Ratio: 7 RATIO
Triglycerides: 340 mg/dL — ABNORMAL HIGH (ref <150)
VLDL: 68 mg/dL — ABNORMAL HIGH (ref 0–40)

## 2021-05-07 LAB — TSH: TSH: 1.638 u[IU]/mL (ref 0.350–4.500)

## 2021-05-07 LAB — ETHANOL: Alcohol, Ethyl (B): <10 mg/dL (ref <10)

## 2021-05-07 LAB — POC SARS CORONAVIRUS 2 AG -  ED: SARS Coronavirus 2 Ag: NEGATIVE

## 2021-05-07 LAB — RESP PANEL BY RT-PCR (FLU A&B, COVID) ARPGX2
Influenza A by PCR: NEGATIVE
Influenza B by PCR: NEGATIVE
SARS Coronavirus 2 by RT PCR: NEGATIVE

## 2021-05-07 LAB — POC SARS CORONAVIRUS 2 AG: SARSCOV2ONAVIRUS 2 AG: NEGATIVE

## 2021-05-07 MED ORDER — ARIPIPRAZOLE 15 MG PO TABS
15.0000 mg | ORAL_TABLET | Freq: Every day | ORAL | Status: DC
  Administered 2021-05-07: 15 mg via ORAL
  Filled 2021-05-07: qty 1

## 2021-05-07 MED ORDER — INSULIN ASPART 100 UNIT/ML IJ SOLN: 0.0000 [IU] | Freq: Three times a day (TID) | INTRAMUSCULAR | Status: DC

## 2021-05-07 MED ORDER — ACETAMINOPHEN 325 MG PO TABS
650.0000 mg | ORAL_TABLET | Freq: Four times a day (QID) | ORAL | Status: DC | PRN
  Administered 2021-05-07: 650 mg via ORAL
  Filled 2021-05-07: qty 2

## 2021-05-07 MED ORDER — ALUM & MAG HYDROXIDE-SIMETH 200-200-20 MG/5ML PO SUSP: 30.0000 mL | ORAL | Status: DC | PRN

## 2021-05-07 MED ORDER — INSULIN GLARGINE-YFGN 100 UNIT/ML ~~LOC~~ SOLN
30.0000 [IU] | Freq: Once | SUBCUTANEOUS | Status: AC
  Administered 2021-05-07: 30 [IU] via SUBCUTANEOUS

## 2021-05-07 MED ORDER — INSULIN ASPART 100 UNIT/ML IJ SOLN
0.0000 [IU] | Freq: Every day | INTRAMUSCULAR | Status: DC
  Administered 2021-05-07: 2 [IU] via SUBCUTANEOUS

## 2021-05-07 MED ORDER — INSULIN GLARGINE-YFGN 100 UNIT/ML ~~LOC~~ SOLN: 30.0000 [IU] | Freq: Every day | SUBCUTANEOUS | Status: DC

## 2021-05-07 MED ORDER — MAGNESIUM HYDROXIDE 400 MG/5ML PO SUSP: 30.0000 mL | Freq: Every day | ORAL | Status: DC | PRN

## 2021-05-07 MED ORDER — HYDROXYZINE HCL 25 MG PO TABS
25.0000 mg | ORAL_TABLET | Freq: Three times a day (TID) | ORAL | Status: DC | PRN
  Administered 2021-05-07: 25 mg via ORAL
  Filled 2021-05-07: qty 1

## 2021-05-07 MED ORDER — INSULIN ASPART 100 UNIT/ML IJ SOLN
5.0000 [IU] | Freq: Three times a day (TID) | INTRAMUSCULAR | Status: DC
  Administered 2021-05-07 – 2021-05-08 (×3): 5 [IU] via SUBCUTANEOUS

## 2021-05-07 MED ORDER — INSULIN ASPART 100 UNIT/ML IJ SOLN
0.0000 [IU] | Freq: Three times a day (TID) | INTRAMUSCULAR | Status: DC
  Administered 2021-05-07: 9 [IU] via SUBCUTANEOUS
  Administered 2021-05-08: 5 [IU] via SUBCUTANEOUS
  Administered 2021-05-08: 3 [IU] via SUBCUTANEOUS

## 2021-05-07 NOTE — BH Assessment (Signed)
Danny Werner, Urgent, MR #[id number]; 26 year old presents voluntarily to [facility], via [employer], and unaccompanied.  Pt reports suicidal ideation with a plan to overdose on medication.  Pt denies HI or AVH,  Pt admits to prior MH diagnosis or prescribed medication for symptom management,  MSE signed by patient.

## 2021-05-07 NOTE — ED Notes (Signed)
Pt alert and oriented x4. Affect bright.  Pensive mood. Pt reports that he and his Grandmother got into a verbal altercation and she " told me to go kill myself about five times".  Pt reports that he had an Aunt that committed suicide and when he was in his early teens he attempted.   States that GM telling him this " made me think about it real hard".  Pt searched prior to being brought onto OBS unit.  No contraband noted.  Skin check performed.  Oriented to mileu and given sandwich and juice.    No apparent distress noted.  Will monitor for safety.

## 2021-05-07 NOTE — Discharge Instructions (Addendum)
Will not be able to restart pump until 24 hours after Lantus is given. (It was given at 7pm).   You have an appt Thursday, 1/26 at 1pm please check your email [email or web address] this is a virtual program with [facility]. [phone]  The suicide prevention education provided includes the following: Suicide risk factors Suicide prevention and interventions National Suicide Hotline telephone number [facility] assessment telephone number [facility] Emergency Assistance [place] and/or Residential Mobile Crisis Unit telephone number   Request made of family/significant other to: Remove weapons (e.g., guns, rifles, knives), all items previously/currently identified as safety concern.   Remove drugs/medications (over the counter, prescriptions, illicit drugs), all items previously/currently identified as a safety concern.   Please contact one of the following facilities to start medication management and therapy services:   [facility] [address]  [place], [place] [address] [phone]   [facility]  [address] [place], [place] [address] [phone]  [facility]  [address] [place], [place] [address] [phone]  [facility]  [address]  [place], [place] [address] [phone]  [facility]  [place], [place] [address] [phone]  [facility]  [address],  [place], [place] [address] [phone]

## 2021-05-07 NOTE — BH Assessment (Incomplete)
Comprehensive Clinical Assessment (CCA) Note  05/07/2021 Danny Werner [id number]  Disposition:  Gave clinical report to P. White, NP, who determined that Pt is appropriate for overnight observation.  The patient demonstrates the following risk factors for suicide: Chronic risk factors for suicide include: psychiatric disorder of Bipolar I . Acute risk factors for suicide include: family or marital conflict. Protective factors for this patient include: positive therapeutic relationship. Considering these factors, the overall suicide risk at this point appears to be moderate. Patient is not appropriate for outpatient follow up.   [facility] ED from 05/07/2021 in [facility] ED from 03/01/2021 in [facility] ED from 12/11/2020 in [facility] DEPT  C-SSRS RISK CATEGORY Moderate Risk No Risk No Risk       Chief Complaint:  Chief Complaint  Patient presents with   Suicidal    Pt stated that he felt suicidal with thoughts of overdosing after grandmother told him to kill himself during altercation.   Visit Diagnosis: Bipolar I, Depressed   Pt is a 26 year old male who presented to [facility] on a voluntary basis with complaint of recent suicidal ideation following conflict with grandmother.  Pt reported that he lives with grandparents, is unemployed, and is treated for Bipolar I Disorder through [employer].  Pt stated that he is prescribed and takes Abilify.  Pt reported that he had conflict with grandmother today, and she told him to kill himself.  Pt became (and is) distraught and has thoughts of committing suicide by overdose.  Pt stated that he has not attempted suicide before, but he has struggled with thoughts in the past.  Pt endorsed persistent despondency and feelings of hopelessness.  He also endorsed ongoing anxiety and isolation.  Pt denied homicidal ideation, hallucination, self-injurious behavior,  and substance use concerns.    During assessment, Pt presented as alert and oriented.  He had good eye contact and was cooperative.  Pt was dressed in street clothes, and he was appropriately groomed.  Pt's mood was depressed and anxious.  Affect was full range.  Pt's speech was normal in rate, rhythm, and volume. Thought processes were within normal range, and thought content was logical and goal-oriented.  There was no evidence of delusion.  Memory and concentration were intact.  Insight, judgment, and impulse control were fair.  CCA Screening, Triage and Referral (STR)  Patient Reported Information How did you hear about [place]? Legal System  What Is the Reason for Your Visit/Call Today? SI with a plan to overdose on medication  How Long Has This Been Causing You Problems? <Week  What Do You Feel Would Help You the Most Today? Treatment for Depression or other mood problem   Have You Recently Had Any Thoughts About Hurting Yourself? Yes  Are You Planning to Commit Suicide/Harm Yourself At This time? Yes   Have you Recently Had Thoughts About Hurting Someone Gu[place]upe Dawn? No  Are You Planning to Harm Someone at This Time? No  Explanation: No data recorded  Have You Used Any Alcohol or Drugs in the Past 24 Hours? No  How Long Ago Did You Use Drugs or Alcohol? No data recorded What Did You Use and How Much? No data recorded  Do You Currently Have a Therapist/Psychiatrist? No data recorded Name of Therapist/Psychiatrist: No data recorded  Have You Been Recently Discharged From Any Office Practice or Programs? No data recorded Explanation of Discharge From Practice/Program: No data recorded  CCA Screening Triage Referral Assessment Type of Contact: No data recorded Telemedicine Service Delivery:   Is this Initial or Reassessment? No data recorded Date Telepsych consult ordered in CHL:  No data recorded Time Telepsych consult ordered in CHL:  No data recorded Location of Assessment:  No data recorded Provider Location: No data recorded  Collateral Involvement: No data recorded  Does Patient Have a [place]? No data recorded Name and Contact of Legal Guardian: No data recorded If Minor and Not Living with Parent(s), Who has Custody? No data recorded Is CPS involved or ever been involved? No data recorded Is APS involved or ever been involved? No data recorded  Patient Determined To Be At Risk for Harm To Self or Others Based on Review of Patient Reported Information or Presenting Complaint? No data recorded Method: No data recorded Availability of Means: No data recorded Intent: No data recorded Notification Required: No data recorded Additional Information for Danger to Others Potential: No data recorded Additional Comments for Danger to Others Potential: No data recorded Are There Guns or Other Weapons in Your Home? No data recorded Types of Guns/Weapons: No data recorded Are These Weapons Safely Secured?                            No data recorded Who Could Verify You Are Able To Have These Secured: No data recorded Do You Have any Outstanding Charges, Pending Court Dates, Parole/Probation? No data recorded Contacted To Inform of Risk of Harm To Self or Others: No data recorded   Does Patient Present under Involuntary Commitment? No data recorded IVC Papers Initial File Date: No data recorded  [place] of Residence: No data recorded  Patient Currently Receiving the Following Services: No data recorded  Determination of Need: Urgent (48 hours)   Options For Referral: Facility-Based Crisis     CCA Biopsychosocial Patient Reported Schizophrenia/Schizoaffective Diagnosis in Past: No   Strengths: Some insight; seeks treatment   Mental Health Symptoms Depression:   Change in energy/activity; Hopelessness   Duration of Depressive symptoms:  Duration of Depressive Symptoms: Greater than two weeks   Mania:   None   Anxiety:     Tension; Worrying   Psychosis:   None   Duration of Psychotic symptoms:    Trauma:   None   Obsessions:   None   Compulsions:   None   Inattention:   None   Hyperactivity/Impulsivity:   None   Oppositional/Defiant Behaviors:   None   Emotional Irregularity:   None   Other Mood/Personality Symptoms:  No data recorded   Mental Status Exam Appearance and self-care  Stature:   Average   Weight:   Average weight   Clothing:   Casual   Grooming:   Normal   Cosmetic use:   None   Posture/gait:   Normal   Motor activity:   Not Remarkable   Sensorium  Attention:   Normal   Concentration:   Normal   Orientation:   X5   Recall/memory:   Normal   Affect and Mood  Affect:   Appropriate   Mood:   Depressed; Anxious   Relating  Eye contact:   Normal   Facial expression:   Sad   Attitude toward examiner:   Cooperative   Thought and Language  Speech flow:  Clear and Coherent   Thought content:   Appropriate to Mood and Circumstances   Preoccupation:  None   Hallucinations:   None   Organization:  No data recorded  [employer] of Knowledge:   Average   Intelligence:   Average   Abstraction:   Normal   Judgement:   Fair   Art therapist:   Realistic   Insight:   Fair   Decision Making:   Normal   Social Functioning  Social Maturity:   Isolates   Social Judgement:   Victimized   Stress  Stressors:   Family conflict   Coping Ability:   Overwhelmed   Skill Deficits:   None   Supports:   Friends/Service system     Religion:    Leisure/Recreation:    Exercise/Diet: Exercise/Diet Do You Exercise?: Yes What Type of Exercise Do You Do?: Run/Walk How Many Times a Week Do You Exercise?: 6-7 times a week Have You Gained or Lost A Significant Amount of Weight in the Past Six Months?: Yes-Lost Do You Follow a Special Diet?: No Do You Have Any Trouble Sleeping?: No   CCA  Employment/Education Employment/Work Situation: Employment / Work Situation Employment Situation: Unemployed Has Patient ever Been in Passenger transport manager?: No  Education: Education Is Patient Currently Attending School?: No Last Grade Completed: 12 Did You Nutritional therapist?: No Did You Have An Individualized Education Program (IIEP): No Did You Have Any Difficulty At [employer]?: No Patient's Education Has Been Impacted by Current Illness: No   CCA Family/Childhood History Family and Relationship History: Family history Marital status: Single Does patient have children?: No  Childhood History:     Child/Adolescent Assessment:     CCA Substance Use Alcohol/Drug Use: Alcohol / Drug Use Pain Medications: Please see MAR Prescriptions: Please see MAR Over the Counter: Please see MAR History of alcohol / drug use?: No history of alcohol / drug abuse                         ASAM's:  Six Dimensions of Multidimensional Assessment  Dimension 1:  Acute Intoxication and/or Withdrawal Potential:      Dimension 2:  Biomedical Conditions and Complications:      Dimension 3:  Emotional, Behavioral, or Cognitive Conditions and Complications:     Dimension 4:  Readiness to Change:     Dimension 5:  Relapse, Continued use, or Continued Problem Potential:     Dimension 6:  Recovery/Living Environment:     ASAM Severity Score:    ASAM Recommended Level of Treatment:     Substance use Disorder (SUD)    Recommendations for Services/Supports/Treatments:    Discharge Disposition:    DSM5 Diagnoses: Patient Active Problem List   Diagnosis Date Noted   Diabetes ([place]) 10/24/2018   Intermittent explosive disorder 12/24/2017   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

## 2021-05-07 NOTE — Progress Notes (Incomplete)
Inpatient Diabetes Program Recommendations  AACE/ADA: New Consensus Statement on Inpatient Glycemic Control (2015)  Target Ranges:  Prepandial:   less than 140 mg/dL      Peak postprandial:   less than 180 mg/dL (1-2 hours)      Critically ill patients:  140 - 180 mg/dL   Lab Results  Component Value Date   GLUCAP 310 (H) 05/07/2021   HGBA1C 10.1 (A) 10/21/2018    Review of Glycemic Control  Diabetes history: DM2 Outpatient Diabetes medications: insulin pump Current orders for Inpatient glycemic control: Lantus 30 QHS, Novolog 0-9 units TID with meals and 0-5 HS + 5 units TID  Insulin pump settings: Basal - 1.5/H - total 36 units Bolus - Target 150 CF 50 CHO ratio 1:8  Endo - Adrienne Mocha, PA Pump: Vance Gather with Dexcom  Inpatient Diabetes Program Recommendations:    Lantus 30 units QHS Novolog 0-9 units TID with meals and 0-5 HS Novolog 5 units TID with meals if eating > 50%  Spoke with pt on phone regarding his pump. States he worries about lows, therefore does not always bolus for meals. Consumes 1 meal daily sometimes.  Will likely need to titrate up both Lantus and Novolog.   Will f/u in am.   Thank you. Lorenda Peck, RD, LDN, CDE Inpatient Diabetes Coordinator [phone]

## 2021-05-07 NOTE — ED Notes (Signed)
Received patient this PM. Patient sitting on the side of his bed looking around. Patient respirations are even and unlabored. Will continue to monitor for safety.
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
## 2021-05-07 NOTE — ED Notes (Signed)
Rechecked CBG per MD order via secure chat is 444. Advised provider via secure chat.

## 2021-05-07 NOTE — ED Notes (Signed)
[facility] called to pick up STAT specimens and to deliver to [facility].

## 2021-05-07 NOTE — Progress Notes (Signed)
Per Deirdre Pippins (DM coordinator)  recommends to give lantus [facility]) now and not wait til tonight. Lantus given now.  Last CBG 386

## 2021-05-07 NOTE — ED Notes (Signed)
CBG rechecked per VO, 386. Provider aware.

## 2021-05-08 LAB — GLUCOSE, CAPILLARY
Glucose-Capillary: 251 mg/dL — ABNORMAL HIGH (ref 70–99)
Glucose-Capillary: 237 mg/dL — ABNORMAL HIGH (ref 70–99)

## 2021-05-08 LAB — HEMOGLOBIN A1C
Hgb A1c MFr Bld: 10.5 % — ABNORMAL HIGH (ref 4.8–5.6)
Mean Plasma Glucose: 255 mg/dL

## 2021-05-08 NOTE — ED Notes (Signed)
Pt wants to know if his grandmother can come pick him up. Informed provider via secure chat of Pt's request. Pt on the observation unit conversing with other Pt's and walking around. No distress observed. Safety maintained and will continue to monitor.

## 2021-05-08 NOTE — ED Provider Notes (Signed)
FBC/OBS ASAP Discharge Summary  Date and Time: 05/08/2021 8:54 AM  Name: Danny Werner  MRN:  [id number]   Discharge Diagnoses:  Final diagnoses:  Suicidal ideation    Subjective:  Danny Werner, 26 y.o., male patient who initially presented to [facility] on 05/07/2021 and was admitted to the continuous assessment unit for overnight observation.  Patient initially presented with SI after an argument with his grandmother over the insulin pump.  Patient is reassessed today, seen face to face by this provider, consulted with Dr. Serafina Mitchell; and chart reviewed on 05/08/21.  Patient is requesting to be discharged.  Per chart review patient has a history of ADHD, intermittent explosive disorder, PTSD, anxiety, depression and type 2 diabetes.  He has outpatient services with [facility] Dr. Earlean Polka.  Reports he takes Abilify 15 mg daily, Intuniv 4 mg ER tablet at bedtime, and he currently has a insulin pump.  He has not worn the pump while on the unit.  He was administered sliding scale insuline with meals and a long-acting insulin.   Danny Werner reports he has already talked to his grandmother and mother this morning.  States everyone is "cool" with him coming home.  Reports it was all a big misunderstanding.  [name] is alert/oriented x4 on evaluation.  He makes good eye contact.  His speech is at a clear tone, moderate volume, and normal pace.  He is anxious with a congruent affect.  He denies depression at this time.  Denies any concerns with appetite and sleep.  He does not appear to be responding to internal/external stimuli.  He denies AVH.  He denies SI/HI at this time.  He contracts for safety.  Denies access to firearms/weapons.  Denies access to medications.  Reports his grandmother prepares his medications daily.  Discussed patient's risk factors.  His grandmother, grandfather, aunt, and a cousin aunt all completed suicide.  Discussed coping skills and suicide interventions/prevention.   Educated on 988 talk. Reports his grandfather has all weapons and knives locked away.   Collateral: Danny Werner patient's mother.  Reports that patient explodes at times over the smallest things.  States he stays in his room and plays video games all day.  He has no motivation.  He does not follow-up with any outpatient providers.  He does not keep up with his own medications.  Reports his grandmother manages his medicines including his insulin pump.  Educated patient and mother that insulin pump could not be put back on until 24 hours after long-acting Lantus had been administered. Educated do not place insulin pump back on until after 7 PM this evening.  Patient and his mother verbalized understanding.  Mother states this is patient's last opportunity to have housing.  States if he does not complete the PHP program he will not be allowed to live with his grandmother any longer.   Stay Summary:   Romulo remained calm and cooperative while on the unit.  He interacted with staff and other patients appropriately.  He exhibited no unsafe behaviors while on the unit and continued to deny SI/HI/AVH with nursing staff.  Discussed and provided outpatient psychiatric resources.  Patient is unsure due to his insurance that he continue to see his current psychiatrist.  Patient and his mother report that he has private insurance.  Discussed PHP program with patient he is willing to participate.  Referral was made to [facility] PHP.Patient was educated that he can not put his insulin pump back on until  after 7 pm.   Upon completion of this admission the LOC FEINSTEIN was both mentally and medically stable for discharge denying suicidal/homicidal ideation, auditory/visual/tactile hallucinations, delusional thoughts and paranoia.     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   Depression    Depression    Diabetes mellitus without complication ([place])    Intermittent explosive disorder    Migraines    Trauma     Past Surgical History:  Procedure Laterality Date   ADENOIDECTOMY     ANAL RECTAL MANOMETRY N/A 01/18/2016   Procedure: ANO RECTAL MANOMETRY;  Surgeon: Arta Silence, MD;  Location: [facility] ENDOSCOPY;  Service: Endoscopy;  Laterality: N/A;   FRACTURE SURGERY     MOUTH SURGERY     TONSILLECTOMY     Family History:  Family History  Problem Relation Age of Onset   Asthma Mother    Diabetes Neg Hx    Family Psychiatric History: See h&p Social History:  Social History   Substance and Sexual Activity  Alcohol Use No     Social History   Substance and Sexual Activity  Drug Use No    Social History   Socioeconomic History   Marital status: Single    Spouse name: Not on file   Number of children: Not on file   Years of education: Not on file   Highest education level: Not on file  Occupational History   Not on file  Tobacco Use   Smoking status: Former   Smokeless tobacco: Never  Vaping Use   Vaping Use: Every day  Substance and Sexual Activity   Alcohol use: No   Drug use: No   Sexual activity: Not on file  Other Topics Concern   Not on file  Social History Narrative   Not on file   Social Determinants of Health   Financial Resource Strain: Not on file  Food Insecurity: Not on file  Transportation Needs: Not on file  Physical Activity: Not on file  Stress: Not on file  Social Connections: Not on file   SDOH:  SDOH Screenings   Alcohol Screen: Not on file  Depression (PHQ2-9): Low Risk    PHQ-2 Score: 4  Financial Resource Strain: Not on file  Food Insecurity: Not on file  Housing: Not on file  Physical Activity: Not on file  Social Connections: Not on file  Stress: Not on file  Tobacco Use: Medium Risk   Smoking Tobacco Use: Former   Smokeless Tobacco Use: Never    Passive Exposure: Not on file  Transportation Needs: Not on file    Tobacco Cessation:  N/A, patient does not currently use tobacco products  Current Medications:  Current Facility-Administered Medications  Medication Dose Route Frequency Provider Last Rate Last Admin   acetaminophen (TYLENOL) tablet 650 mg  650 mg Oral Q6H PRN White, Patrice L, NP   650 mg at 05/07/21 1634   alum & mag hydroxide-simeth (MAALOX/MYLANTA) 200-200-20 MG/5ML suspension 30 mL  30 mL Oral Q4H PRN White, Patrice L, NP       ARIPiprazole (ABILIFY) tablet 15 mg  15 mg Oral QHS White, Patrice L, NP   15 mg at 05/07/21 2128   hydrOXYzine (ATARAX) tablet 25 mg  25 mg Oral TID PRN  White, Patrice L, NP   25 mg at 05/07/21 1845   insulin aspart (novoLOG) injection 0-5 Units  0-5 Units Subcutaneous QHS White, Patrice L, NP   2 Units at 05/07/21 2128   insulin aspart (novoLOG) injection 0-9 Units  0-9 Units Subcutaneous TID WC White, Patrice L, NP   5 Units at 05/08/21 0747   insulin aspart (novoLOG) injection 5 Units  5 Units Subcutaneous TID WC White, Patrice L, NP   5 Units at 05/08/21 0751   insulin glargine-yfgn (SEMGLEE) injection 30 Units  30 Units Subcutaneous QHS White, Patrice L, NP       magnesium hydroxide (MILK OF MAGNESIA) suspension 30 mL  30 mL Oral Daily PRN White, Patrice L, NP       Current Outpatient Medications  Medication Sig Dispense Refill   guanFACINE (INTUNIV) 4 MG TB24 ER tablet Take 1 tablet by mouth at bedtime.     Insulin Disposable Pump (OMNIPOD DASH INTRO, GEN 4,) KIT Change q48 h     insulin lispro (HUMALOG) 100 UNIT/ML injection Insulin pump     ARIPiprazole (ABILIFY) 15 MG tablet Take 15 mg by mouth daily.     EPINEPHrine 0.3 mg/0.3 mL IJ SOAJ injection Inject 0.3 mg into the skin once as needed for anaphylaxis. Inject into the middle of the outer thigh and hold for 3 seconds as needed for severe allergic reaction then call 911 if used.     glipiZIDE (GLUCOTROL) 5 MG tablet Take 5 mg by  mouth at bedtime.     topiramate (TOPAMAX) 50 MG tablet Take 50 mg by mouth at bedtime.     TRULICITY 1.5 NG/2.9BM SOPN Inject 0.5 mLs into the skin daily.      PTA Medications: (Not in a hospital admission)   Musculoskeletal  Strength & Muscle Tone: within normal limits Gait & Station: normal Patient leans: N/A  Psychiatric Specialty Exam  Presentation  General Appearance: Appropriate for Environment; Casual  Eye Contact:Good  Speech:Clear and Coherent; Normal Rate  Speech Volume:Normal  Handedness:Right   Mood and Affect  Mood:Anxious  Affect:Congruent   Thought Process  Thought Processes:Coherent  Descriptions of Associations:Intact  Orientation:Full (Time, Place and Person)  Thought Content:Logical  Diagnosis of Schizophrenia or Schizoaffective disorder in past: No    Hallucinations:Hallucinations: None  Ideas of Reference:None  Suicidal Thoughts:Suicidal Thoughts: No  Homicidal Thoughts:Homicidal Thoughts: No   Sensorium  Memory:Immediate Good; Recent Good; Remote Good  Judgment:Good  Insight:Good   Executive Functions  Concentration:Good  Attention Span:Good  [place] of Knowledge:Good  Language:Good   Psychomotor Activity  Psychomotor Activity:Psychomotor Activity: Normal   Assets  Assets:Communication Skills; Desire for Improvement; Financial Resources/Insurance; Physical Health; Resilience   Sleep  Sleep:Sleep: Fair Number of Hours of Sleep: 7   Nutritional Assessment (For OBS and FBC admissions only) Has the patient had a weight loss or gain of 10 pounds or more in the last 3 months?: No Has the patient had a decrease in food intake/or appetite?: No Does the patient have dental problems?: No Does the patient have eating habits or behaviors that may be indicators of an eating disorder including binging or inducing vomiting?: No Has the patient recently lost weight without trying?: 0 Has the patient been eating  poorly because of a decreased appetite?: 0 Malnutrition Screening Tool Score: 0    Physical Exam  Physical Exam Vitals and nursing note reviewed.  Constitutional:      General: He is not in acute distress.  Appearance: Normal appearance. He is well-developed.  HENT:     Head: Normocephalic and atraumatic.  Eyes:     General:        Right eye: No discharge.        Left eye: No discharge.     Conjunctiva/sclera: Conjunctivae normal.  Cardiovascular:     Rate and Rhythm: Normal rate.  Pulmonary:     Effort: Pulmonary effort is normal. No respiratory distress.     Breath sounds: Normal breath sounds.  Musculoskeletal:        General: Normal range of motion.     Cervical back: Normal range of motion.  Skin:    Coloration: Skin is not jaundiced or pale.  Neurological:     Mental Status: He is alert and oriented to person, place, and time.  Psychiatric:        Attention and Perception: Attention and perception normal.        Mood and Affect: Mood is anxious.        Speech: Speech normal.        Behavior: Behavior normal. Behavior is cooperative.        Thought Content: Thought content normal.        Cognition and Memory: Cognition normal.        Judgment: Judgment is impulsive.   Review of Systems  Constitutional: Negative.   HENT: Negative.    Eyes: Negative.   Respiratory: Negative.    Cardiovascular: Negative.   Genitourinary: Negative.   Musculoskeletal: Negative.   Skin: Negative.   Neurological: Negative.   Blood pressure 113/65, pulse (!) 54, temperature 97.7 F (36.5 C), temperature source Oral, resp. rate 16, SpO2 99 %. There is no height or weight on file to calculate BMI.  Demographic Factors:  Male, Adolescent or young adult, Caucasian, and Unemployed  Loss Factors: Financial problems/change in socioeconomic status  Historical Factors: Family history of suicide, Family history of mental illness or substance abuse, and Impulsivity  Risk Reduction  Factors:   Sense of responsibility to family, Living with another person, especially a relative, Positive social support, Positive therapeutic relationship, and Positive coping skills or problem solving skills  Continued Clinical Symptoms:  Severe Anxiety and/or Agitation Depression:   Impulsivity More than one psychiatric diagnosis  Cognitive Features That Contribute To Risk:  None    Suicide Risk:  Minimal: No identifiable suicidal ideation.  Patients presenting with no risk factors but with morbid ruminations; may be classified as minimal risk based on the severity of the depressive symptoms  Plan Of Care/Follow-up recommendations:  Activity:  as tolerated  Diet:  regular   Disposition:   Discharge patient.  Referral made to [facility] program.   Provided outpatient psychiatric resources including medication management and therapy.   No evidence of imminent risk to self or others at present.    Patient does not meet criteria for psychiatric inpatient admission. Discussed crisis plan, support from social network, calling 911, coming to the Emergency Department, and calling Suicide Hotline.   Revonda Humphrey, NP 05/08/2021, 8:54 AM

## 2021-05-08 NOTE — Progress Notes (Signed)
Inpatient Diabetes Program Recommendations  AACE/ADA: New Consensus Statement on Inpatient Glycemic Control (2015)  Target Ranges:  Prepandial:   less than 140 mg/dL      Peak postprandial:   less than 180 mg/dL (1-2 hours)      Critically ill patients:  140 - 180 mg/dL   Lab Results  Component Value Date   GLUCAP 251 (H) 05/08/2021   HGBA1C 10.1 (A) 10/21/2018    Review of Glycemic Control  Latest Reference Range & Units 05/07/21 18:13 05/07/21 21:10 05/08/21 07:32  Glucose-Capillary 70 - 99 mg/dL 386 (H) 229 (H) 251 (H)  (H): Data is abnormally high  Diabetes history: DM2 Outpatient Diabetes medications: insulin pump Current orders for Inpatient glycemic control: Lantus 30 QHS, Novolog 0-9 units TID with meals and 0-5 HS + 5 units TID   Insulin pump settings: Basal - 1.5/H - total 36 units Bolus - Target 150 CF 50 CHO ratio 1:8   Endo - Adrienne Mocha, PA Pump: Vance Gather with Dexcom  Inpatient Diabetes Program Recommendations:    Semglee 36 units QHS (home dose) A1C pending  Will continue to follow while inpatient.  Thank you, Reche Dixon, MSN, RN Diabetes Coordinator Inpatient Diabetes Program [phone] (team pager from 8a-5p)

## 2021-05-08 NOTE — ED Notes (Signed)
Discharge instructions provided and Pt stated understanding. Pt alert, orient and ambulatory prior to d/c from facility. Personal belongings returned from locker number 17. Escorted Pt to the front lobby to wait for his grandmother to pick him up. Safety maintained.

## 2021-05-08 NOTE — ED Notes (Signed)
Patient denies SI. Stated he had a long talk with mother and no longer wants to harm himself.  Patient stated that he felt better than when he arrived earlier. Night medication administered without difficulty to patient. Patient given insulin coverage and request a night snack. Patient interacting well with staff. Patient is cooperative.  Respiratory is even and unlabored. No distress noted. Patient sleeping or resting in bed at present. Patient stated no complaints at present. Will continue to monitor for safety.

## 2021-05-08 NOTE — ED Notes (Signed)
Pt has been interacting, talking and laughing with other pts.

## 2021-05-08 NOTE — ED Notes (Signed)
Patient in his bed asleep. Patient respirations are even and unlabored. Will continue to monitor for safety.

## 2021-05-08 NOTE — ED Notes (Signed)
Pt alert and oriented to person place and date. Pt is sitting up in bed. Reports that " I had a real good talk with my mom last night and I am not feeling suicidal..I just want to do what will better my life."   Pt CBG this am  251  and he was given 10 units as prescribed.  Pt Currently eating cereal. No distress noted. Awaiting Provider's disposition.

## 2021-05-09 ENCOUNTER — Telehealth (HOSPITAL_COMMUNITY): Payer: Self-pay | Admitting: Professional

## 2021-05-11 ENCOUNTER — Other Ambulatory Visit (HOSPITAL_COMMUNITY): Payer: No Payment, Other | Attending: Psychiatry | Admitting: Professional

## 2021-05-11 ENCOUNTER — Other Ambulatory Visit: Payer: Self-pay

## 2021-05-11 DIAGNOSIS — F3130 Bipolar disorder, current episode depressed, mild or moderate severity, unspecified: Secondary | ICD-10-CM
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
## 2021-05-15 ENCOUNTER — Telehealth (HOSPITAL_COMMUNITY): Payer: Self-pay | Admitting: Professional

## 2021-05-15 ENCOUNTER — Other Ambulatory Visit (HOSPITAL_COMMUNITY): Payer: No Payment, Other

## 2021-05-15 ENCOUNTER — Ambulatory Visit (HOSPITAL_COMMUNITY): Payer: No Payment, Other

## 2021-05-16 ENCOUNTER — Telehealth (HOSPITAL_COMMUNITY): Payer: Self-pay | Admitting: Professional

## 2021-05-16 ENCOUNTER — Ambulatory Visit (HOSPITAL_COMMUNITY): Payer: No Payment, Other

## 2021-05-16 ENCOUNTER — Other Ambulatory Visit (HOSPITAL_COMMUNITY): Payer: No Payment, Other

## 2021-05-16 NOTE — Telephone Encounter (Signed)
Cln called pt. Pt answered and was with grandmother. Pt provided cln permission to speak with grandmother. Cln apologizes for the confusion and reminds pt that she cannot speak with anyone but him due to HIPPA outside of this call. Pt and grandmother report they have not received email communications from this cln and have sent paperwork to this cln via email. Cln apologizes and verifies that an email was sent at 936 this morning after cln was unable to reach pt. Cln verifies no emails have been received by pt. Cln verifies email address pt should send paperwork to [email or web address]). Cln reminds pt that when cln and pt spoke yesterday, a start date of 2/1 was agreed upon because this cln did not have any further openings for pts to start group on 1/31. Pt reports remembering with "yeah." Cln reminds pt to send a picture of the front and back of his insurance card with paperwork so a prior authorization can be completed on his start date of 2/1. Pt and grandmother report understanding. Cln reminds pt that the invite for group will come at 9a in the morning. If pt does not get the invite, he is to reach out via phone at [phone]. Pt reports understanding. Cln tells pt that if he is unable to send the paperwork via email, he will need to drop the paperwork off at the office tomorrow after group. Pt agrees. Pt denies any SI/HI at this time.

## 2021-05-17 ENCOUNTER — Other Ambulatory Visit: Payer: Self-pay

## 2021-05-17 ENCOUNTER — Other Ambulatory Visit (HOSPITAL_COMMUNITY): Payer: 59

## 2021-05-17 ENCOUNTER — Other Ambulatory Visit (HOSPITAL_COMMUNITY): Payer: 59 | Attending: Psychiatry | Admitting: Licensed Clinical Social Worker

## 2021-05-17 DIAGNOSIS — F322 Major depressive disorder, single episode, severe without psychotic features: Secondary | ICD-10-CM | POA: Diagnosis not present

## 2021-05-18 ENCOUNTER — Other Ambulatory Visit: Payer: Self-pay

## 2021-05-18 ENCOUNTER — Other Ambulatory Visit (HOSPITAL_COMMUNITY): Payer: 59

## 2021-05-18 ENCOUNTER — Other Ambulatory Visit (HOSPITAL_COMMUNITY): Payer: 59 | Admitting: Licensed Clinical Social Worker

## 2021-05-18 ENCOUNTER — Encounter (HOSPITAL_COMMUNITY): Payer: Self-pay

## 2021-05-18 DIAGNOSIS — F322 Major depressive disorder, single episode, severe without psychotic features: Secondary | ICD-10-CM | POA: Diagnosis not present

## 2021-05-18 NOTE — Plan of Care (Signed)
Pt agrees to treatment plan.

## 2021-05-19 ENCOUNTER — Other Ambulatory Visit (HOSPITAL_COMMUNITY): Payer: 59 | Admitting: Licensed Clinical Social Worker

## 2021-05-19 ENCOUNTER — Other Ambulatory Visit (HOSPITAL_COMMUNITY): Payer: 59

## 2021-05-19 ENCOUNTER — Other Ambulatory Visit: Payer: Self-pay

## 2021-05-19 DIAGNOSIS — F322 Major depressive disorder, single episode, severe without psychotic features: Secondary | ICD-10-CM

## 2021-05-20 ENCOUNTER — Encounter (HOSPITAL_COMMUNITY): Payer: Self-pay | Admitting: Family

## 2021-05-20 NOTE — Progress Notes (Signed)
Virtual Visit via Video Note  I connected with [name] on 05/18/21 at  9:00 AM EST by a video enabled telemedicine application and verified that I am speaking with the correct person using two identifiers.  Location: Patient: Home Provide: Office   I discussed the limitations of evaluation and management by telemedicine and the availability of in person appointments. The patient expressed understanding and agreed to proceed.    I discussed the assessment and treatment plan with the patient. The patient was provided an opportunity to ask questions and all were answered. The patient agreed with the plan and demonstrated an understanding of the instructions.   The patient was advised to call back or seek an in-person evaluation if the symptoms worsen or if the condition fails to improve as anticipated.  I provided 15 minutes of non-face-to-face time during this encounter.   Derrill Center, NP    Behavioral Health Partial Program Assessment Note  Date: 05/20/2021 Name: Danny Werner MRN: [id number]  Chief Complaint:   Subjective:   CNO:[id number] Danny Werner is a 26 y.o. Caucasian male presents after a recent evaluation by [facility] urgent care.  Reports feeling suicidal after a disagreement.  He denied previous plan or intent.  Denied previous inpatient admissions.  Denied substance abuse history.  He reports a history of major depressive disorder, posttraumatic stress disorder, generalized anxiety and attention deficit disorder.  States he is currently prescribed Abilify and Intuniv for he takes and tolerate medication well.  States he was referred for additional coping skills to partial hospitalization and patient was enrolled in partial psychiatric program on 05/20/21.  Primary complaints include: anxiety, depression worse, and feeling depressed.  Onset of symptoms was gradual with gradually worsening course since that time. Psychosocial Stressors include the following: family  and financial.  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  States he eats follow-up for disability.  Charted collateral:"Danny Werner patient's mother.  Reports that patient explodes at times over the smallest things.  States he stays in his room and plays video games all day.  He has no motivation.  He does not follow-up with any outpatient providers.  He does not keep up with his own medications.  Reports his grandmother manages his medicines including his insulin pump.  Educated patient and mother that insulin pump could not be put back on until 24 hours after long-acting Lantus had been administered. Educated do not place insulin pump back on until after 7 PM this evening.  Patient and his mother verbalized understanding.  Mother states this is patient's last opportunity to have housing. "   I have reviewed the following documentation dated : past psychiatric history and past social and family history  Complaints of Pain: nonear Past Psychiatric History:  First psychiatric contact   Currently in treatment with Abilify and Intuniv    Substance Abuse History: none Use of Alcohol: denied Use of Caffeine: denies use Use of over the counter:   Past Surgical History:  Procedure Laterality Date   ADENOIDECTOMY     ANAL RECTAL MANOMETRY N/A 01/18/2016   Procedure: ANO RECTAL MANOMETRY;  Surgeon: Arta Silence, MD;  Location: [facility] ENDOSCOPY;  Service: Endoscopy;  Laterality: N/A;   FRACTURE SURGERY     MOUTH SURGERY     TONSILLECTOMY      Past Medical History:  Diagnosis Date   ADHD (attention deficit hyperactivity disorder)    Anxiety  Cancer [facility])    tumor in left arm   Depression    Depression    Diabetes mellitus without complication ([place])    Intermittent explosive disorder    Migraines    Trauma    Outpatient Encounter Medications as of 05/17/2021  Medication Sig   ARIPiprazole (ABILIFY) 15 MG tablet Take 15 mg by  mouth daily.   EPINEPHrine 0.3 mg/0.3 mL IJ SOAJ injection Inject 0.3 mg into the skin once as needed for anaphylaxis. Inject into the middle of the outer thigh and hold for 3 seconds as needed for severe allergic reaction then call 911 if used.   glipiZIDE (GLUCOTROL) 5 MG tablet Take 5 mg by mouth at bedtime.   guanFACINE (INTUNIV) 4 MG TB24 ER tablet Take 1 tablet by mouth at bedtime.   Insulin Disposable Pump (OMNIPOD DASH INTRO, GEN 4,) KIT Change q48 h   insulin lispro (HUMALOG) 100 UNIT/ML injection Insulin pump   TRULICITY 1.5 HQ/4.6NG SOPN Inject 0.5 mLs into the skin daily.   No facility-administered encounter medications on file as of 05/17/2021.   Allergies  Allergen Reactions   Bee Venom Anaphylaxis and Swelling   Codeine Other (See Comments)    seizures   Penicillins Other (See Comments)    Seizures  Has patient had a PCN reaction causing immediate rash, facial/tongue/throat swelling, SOB or lightheadedness with hypotension: No Has patient had a PCN reaction causing severe rash involving mucus membranes or skin necrosis: No Has patient had a PCN reaction that required hospitalization: Yes Has patient had a PCN reaction occurring within the last 10 years: No If all of the above answers are "NO", then may proceed with Cephalosporin use.    Promethazine Hcl Other (See Comments)    Seizures.    Social History   Tobacco Use   Smoking status: Former   Smokeless tobacco: Never  Substance Use Topics   Alcohol use: No   Functioning Relationships: good support system Education: Other (Specify any learning disability, behavioral disorder, special education needs, difficulty reading.):  Other Pertinent History: None Family History  Problem Relation Age of Onset   Asthma Mother    Diabetes Neg Hx      Review of Systems Constitutional: negative  Objective:  There were no vitals filed for this visit.  Physical Exam:   Mental Status Exam: Appearance:  Well  groomed Psychomotor::  Within Normal Limits Attention span and concentration: Normal Behavior: calm and cooperative Speech:  normal volume Mood:  depressed Affect:  normal Thought Process:  Coherent Thought Content:  Logical Orientation:  person, place, and time/date Cognition:  grossly intact Insight:  Intact Judgment:  Intact Estimate of Intelligence: Average Fund of knowledge: Aware of current events Memory: Recent and remote intact Abnormal movements: None Gait and station: Normal  Assessment:  Diagnosis: Major Depression   Indications for admission: inpatient care required if not in partial hospital program  Plan:  Orders placed for occupational therapy. patient enrolled in Partial Hospitalization Program, patient's current medications are to be continued, a comprehensive treatment plan will be developed, and side effects of medications have been reviewed with patient  Treatment options and alternatives reviewed with patient and patient understands the above plan. Treatment plan was reviewed and agreed upon by NP T.Bobby Rumpf and patient Nathanel Tallman need for group services.    Derrill Center, NP

## 2021-05-22 ENCOUNTER — Other Ambulatory Visit (HOSPITAL_COMMUNITY): Payer: 59

## 2021-05-22 ENCOUNTER — Other Ambulatory Visit (HOSPITAL_COMMUNITY): Payer: 59 | Attending: Psychiatry | Admitting: Licensed Clinical Social Worker

## 2021-05-22 ENCOUNTER — Encounter (HOSPITAL_COMMUNITY): Payer: Self-pay

## 2021-05-22 ENCOUNTER — Other Ambulatory Visit: Payer: Self-pay

## 2021-05-22 DIAGNOSIS — F322 Major depressive disorder, single episode, severe without psychotic features: Secondary | ICD-10-CM

## 2021-05-22 NOTE — Progress Notes (Signed)
Spoke with patient via Webex video call, used 2 identifiers to correctly identify patient. States this is his first time in [facility] as recommended after a 1 night stay at [facility]. He went to seek help after feeling suicidal without a plan. His health is very stressful. 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 He is followed by [facility] medicine. States it was removed when he was 9 but it came back cancerous. Has not been to [facility] in 2 years but is hoping to make an appointment soon to see about surgery to remove the tumor. Has a long history of suicide completion in his family with his PGF, PGM, as well as cousins. His younger brother has a heart condition and may require open heart surgery. That is a concern he also has. He denies SI/HI or AV hallucinations. Is glad to be in groups and learning. On scale 1-10 as 10  being worst he rates depression at 3 and anxiety at 5. No other issues or complaints. No side effects from medication.

## 2021-05-23 ENCOUNTER — Telehealth (HOSPITAL_COMMUNITY): Payer: Self-pay

## 2021-05-23 ENCOUNTER — Encounter (HOSPITAL_COMMUNITY): Payer: Self-pay | Admitting: Family

## 2021-05-23 ENCOUNTER — Other Ambulatory Visit (HOSPITAL_COMMUNITY): Payer: 59 | Admitting: Licensed Clinical Social Worker

## 2021-05-23 ENCOUNTER — Other Ambulatory Visit (HOSPITAL_COMMUNITY): Payer: 59

## 2021-05-23 DIAGNOSIS — F3130 Bipolar disorder, current episode depressed, mild or moderate severity, unspecified: Secondary | ICD-10-CM

## 2021-05-23 DIAGNOSIS — F322 Major depressive disorder, single episode, severe without psychotic features: Secondary | ICD-10-CM | POA: Diagnosis not present

## 2021-05-23 MED ORDER — ARIPIPRAZOLE 15 MG PO TABS: 15.0000 mg | ORAL_TABLET | Freq: Every day | ORAL | 0 refills | Status: DC

## 2021-05-23 MED ORDER — TOPIRAMATE 50 MG PO TABS: 50.0000 mg | ORAL_TABLET | Freq: Every day | ORAL | 0 refills | Status: DC

## 2021-05-23 NOTE — Progress Notes (Signed)
Cln contacted [employer] to complete insurance review at 3:16 pm and spoke to Alfonse Flavors, who transferred cln to mental health dept. Automated message stated cln would be on hold for 20-40 minutes and cln elected to provide callback number. Cln was contacted by Margart Sickles, who provided CM contact info ([place], [phone]). Cln was transferred to confidential VM and left VM requesting a return call to complete review.

## 2021-05-23 NOTE — BH Assessment (Signed)
Care Management - [place] Follow Up Discharges   Writer attempted to make contact with patient today and was unsuccessful.  Writer left a HIPPA compliant voice message.   Per chart review, patient had an intake PHP app on 05-17-21.  Patient has a follow up appt with the [facility] Program on 05-23-21

## 2021-05-23 NOTE — Progress Notes (Unsigned)
Virtual Visit via Video Note  I connected with [name] on 05/23/21 at  9:00 AM EST by a video enabled telemedicine application and verified that I am speaking with the correct person using two identifiers.  Location: Patient:  Home Provider: Office   I discussed the limitations of evaluation and management by telemedicine and the availability of in person appointments. The patient expressed understanding and agreed to proceed.    I discussed the assessment and treatment plan with the patient. The patient was provided an opportunity to ask questions and all were answered. The patient agreed with the plan and demonstrated an understanding of the instructions.   The patient was advised to call back or seek an in-person evaluation if the symptoms worsen or if the condition fails to improve as anticipated.  I provided 15 minutes of non-face-to-face time during this encounter.   Derrill Center, NP   [facility]  Partial Hospitalization Outpatient Program Discharge Summary  [place] [id number]  Admission date:05/16/2021 Discharge date: 05/24/2021  Reason for admission: per admission assessment note: Danny Werner is a 26 y.o. Caucasian male presents after a recent evaluation by [facility] urgent care.  Reports feeling suicidal after a disagreement.  He denied previous plan or intent.  Denied previous inpatient admissions.  Denied substance abuse history.  He reports a history of major depressive disorder, posttraumatic stress disorder, generalized anxiety and attention deficit disorder.  States he is currently prescribed Abilify and Intuniv for he takes and tolerate medication well.  States he was referred for additional coping skills to partial hospitalization and patient was enrolled in partial psychiatric program on 05/20/21.   Progress in Program Toward Treatment Goals: Ongoing, Arthur attended and participated within the group session on active and active  participation.  Denies suicidal or homicidal ideations. 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 He denied depression or depressive symptoms. Patient has plans to step down to intensive outpatient program.   Progress (rationale): Anticipated to start IOP-05/25/2021  Take all medications as prescribed. Keep all follow-up appointments as scheduled.  Do not consume alcohol or use illegal drugs while on prescription medications. Report any adverse effects from your medications to your primary care provider promptly.  In the event of recurrent symptoms or worsening symptoms, call 911, a crisis hotline, or go to the nearest emergency department for evaluation.    Derrill Center, NP 05/23/2021

## 2021-05-24 ENCOUNTER — Other Ambulatory Visit: Payer: Self-pay

## 2021-05-24 ENCOUNTER — Other Ambulatory Visit (HOSPITAL_COMMUNITY): Payer: 59

## 2021-05-24 ENCOUNTER — Telehealth (HOSPITAL_COMMUNITY): Payer: Self-pay | Admitting: Psychiatry

## 2021-05-24 ENCOUNTER — Other Ambulatory Visit (HOSPITAL_COMMUNITY): Payer: 59 | Admitting: Licensed Clinical Social Worker

## 2021-05-24 DIAGNOSIS — F322 Major depressive disorder, single episode, severe without psychotic features: Secondary | ICD-10-CM

## 2021-05-24 NOTE — Telephone Encounter (Signed)
D:  Pt will be transitioning to MH-IOP from [facility] tomorrow.  A:  Placed call to orient pt, but there was no answer and his voicemail box hadn't been set up.  Pt will start MH-IOP tomorrow at 9 a.m.; group link will be sent this afternoon.

## 2021-05-25 ENCOUNTER — Ambulatory Visit (HOSPITAL_COMMUNITY): Payer: 59 | Admitting: Psychiatry

## 2021-05-25 ENCOUNTER — Telehealth (HOSPITAL_COMMUNITY): Payer: Self-pay | Admitting: Psychiatry

## 2021-05-25 ENCOUNTER — Ambulatory Visit (HOSPITAL_COMMUNITY): Payer: No Payment, Other

## 2021-05-25 ENCOUNTER — Other Ambulatory Visit (HOSPITAL_COMMUNITY): Payer: No Payment, Other

## 2021-05-25 NOTE — Telephone Encounter (Signed)
D:  Pt was scheduled to start virtual MH-IOP today, but he no showed.  A:  Placed second call to pt, but there was no answer and his voicemail still isn't set up.  Inform Ricky Ala, NP.

## 2021-05-26 ENCOUNTER — Ambulatory Visit (HOSPITAL_COMMUNITY): Payer: 59

## 2021-05-26 ENCOUNTER — Other Ambulatory Visit (HOSPITAL_COMMUNITY): Payer: No Payment, Other

## 2021-05-26 ENCOUNTER — Ambulatory Visit (HOSPITAL_COMMUNITY): Payer: No Payment, Other

## 2021-05-29 ENCOUNTER — Ambulatory Visit (HOSPITAL_COMMUNITY): Payer: No Payment, Other

## 2021-05-29 ENCOUNTER — Other Ambulatory Visit (HOSPITAL_COMMUNITY): Payer: No Payment, Other

## 2021-05-29 ENCOUNTER — Ambulatory Visit (HOSPITAL_COMMUNITY): Payer: 59

## 2021-05-30 ENCOUNTER — Other Ambulatory Visit (HOSPITAL_COMMUNITY): Payer: No Payment, Other

## 2021-05-30 ENCOUNTER — Ambulatory Visit (HOSPITAL_COMMUNITY): Payer: No Payment, Other

## 2021-05-30 ENCOUNTER — Ambulatory Visit (HOSPITAL_COMMUNITY): Payer: 59

## 2021-05-31 ENCOUNTER — Other Ambulatory Visit (HOSPITAL_COMMUNITY): Payer: No Payment, Other

## 2021-05-31 ENCOUNTER — Ambulatory Visit (HOSPITAL_COMMUNITY): Payer: 59

## 2021-05-31 ENCOUNTER — Ambulatory Visit (HOSPITAL_COMMUNITY): Payer: No Payment, Other

## 2021-06-01 ENCOUNTER — Ambulatory Visit (HOSPITAL_COMMUNITY): Payer: No Payment, Other

## 2021-06-01 ENCOUNTER — Other Ambulatory Visit (HOSPITAL_COMMUNITY): Payer: No Payment, Other

## 2021-06-01 ENCOUNTER — Ambulatory Visit (HOSPITAL_COMMUNITY): Payer: 59

## 2021-06-02 ENCOUNTER — Other Ambulatory Visit (HOSPITAL_COMMUNITY): Payer: No Payment, Other

## 2021-06-02 ENCOUNTER — Ambulatory Visit (HOSPITAL_COMMUNITY): Payer: No Payment, Other

## 2021-06-02 ENCOUNTER — Ambulatory Visit (HOSPITAL_COMMUNITY): Payer: 59

## 2021-06-05 ENCOUNTER — Ambulatory Visit (HOSPITAL_COMMUNITY): Payer: 59

## 2021-06-06 ENCOUNTER — Ambulatory Visit (HOSPITAL_COMMUNITY): Payer: 59

## 2021-06-07 ENCOUNTER — Ambulatory Visit (HOSPITAL_COMMUNITY): Payer: 59

## 2021-06-08 ENCOUNTER — Ambulatory Visit (HOSPITAL_COMMUNITY): Payer: 59

## 2021-06-09 ENCOUNTER — Ambulatory Visit (HOSPITAL_COMMUNITY): Payer: 59

## 2021-08-08 ENCOUNTER — Encounter: Payer: Self-pay | Admitting: Neurology

## 2021-09-21 ENCOUNTER — Encounter (HOSPITAL_BASED_OUTPATIENT_CLINIC_OR_DEPARTMENT_OTHER): Payer: Self-pay | Admitting: Emergency Medicine

## 2021-09-21 ENCOUNTER — Other Ambulatory Visit: Payer: Self-pay

## 2021-09-21 ENCOUNTER — Emergency Department (HOSPITAL_BASED_OUTPATIENT_CLINIC_OR_DEPARTMENT_OTHER)
Admission: EM | Admit: 2021-09-21 | Discharge: 2021-09-21 | Disposition: A | Discharge status: Home / Self Care | Payer: Commercial Managed Care - HMO | Attending: Emergency Medicine | Admitting: Emergency Medicine

## 2021-09-21 DIAGNOSIS — E1165 Type 2 diabetes mellitus with hyperglycemia: Secondary | ICD-10-CM | POA: Diagnosis not present

## 2021-09-21 DIAGNOSIS — R739 Hyperglycemia, unspecified: Secondary | ICD-10-CM

## 2021-09-21 DIAGNOSIS — Z7984 Long term (current) use of oral hypoglycemic drugs: Secondary | ICD-10-CM | POA: Insufficient documentation

## 2021-09-21 DIAGNOSIS — Z794 Long term (current) use of insulin: Secondary | ICD-10-CM | POA: Insufficient documentation

## 2021-09-21 LAB — CBG MONITORING, ED
Glucose-Capillary: 307 mg/dL — ABNORMAL HIGH (ref 70–99)
Glucose-Capillary: 243 mg/dL — ABNORMAL HIGH (ref 70–99)

## 2021-09-21 NOTE — Discharge Instructions (Signed)
Continue your sliding scale with your insulin.  Hopefully they will get your insulin pump working properly remotely as scheduled for tomorrow.  Return for any new or worse symptoms.

## 2021-09-21 NOTE — ED Provider Notes (Signed)
[place] EMERGENCY DEPT Provider Note   CSN: [id number] Arrival date & time: 09/21/21  1450     History  Chief Complaint  Patient presents with   Hyperglycemia    Danny Werner is a 26 y.o. male.  Patient here because insulin pump malfunctioned he has a new 1 but is not able to set it up.  They have been struggling to get a hold of their endocrinologist.  He has been doing sort of a sliding scale but his sugars have been varying significantly anywhere from 500.  Upon arrival here blood sugar was 307.  Patient without any nausea or vomiting.  Is been eating and drinking fine.  Does not feel dehydrated.  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  Past medical history otherwise significant for attention deficit disorder migraines bipolar disorder.       Home Medications Prior to Admission medications   Medication Sig Start Date End Date Taking? Authorizing Provider  ARIPiprazole (ABILIFY) 15 MG tablet Take 1 tablet (15 mg total) by mouth daily. 05/23/21   Derrill Center, NP  EPINEPHrine 0.3 mg/0.3 mL IJ SOAJ injection Inject 0.3 mg into the skin once as needed for anaphylaxis. Inject into the middle of the outer thigh and hold for 3 seconds as needed for severe allergic reaction then call 911 if used. 10/18/20   [provider]  glipiZIDE (GLUCOTROL) 5 MG tablet Take 5 mg by mouth at bedtime. 09/22/20   [provider]  guanFACINE (INTUNIV) 4 MG TB24 ER tablet Take 1 tablet by mouth at bedtime. 03/28/21   [provider]  Insulin Disposable Pump (OMNIPOD DASH INTRO, GEN 4,) KIT Change q48 h 03/01/21   [provider]  insulin lispro (HUMALOG) 100 UNIT/ML injection Insulin pump 04/27/21   [provider]  topiramate (TOPAMAX) 50 MG tablet Take 1 tablet (50 mg total) by mouth daily. Takes qhs 05/23/21   Derrill Center, NP  TRULICITY 1.5 HD/6.2IW SOPN Inject 0.5 mLs into the skin daily. 11/30/20   [provider]      Allergies    Bee venom, Codeine, Penicillins, and Promethazine hcl    Review of Systems   Review of Systems  Constitutional:  Negative for chills and fever.  HENT:  Negative for ear pain and sore throat.   Eyes:  Negative for pain and visual disturbance.  Respiratory:  Negative for cough and shortness of breath.   Cardiovascular:  Negative for chest pain and palpitations.  Gastrointestinal:  Negative for abdominal pain and vomiting.  Genitourinary:  Negative for dysuria and hematuria.  Musculoskeletal:  Negative for arthralgias and back pain.  Skin:  Negative for color change and rash.  Neurological:  Negative for seizures and syncope.  All other systems reviewed and are negative.   Physical Exam Updated Vital Signs BP 116/80   Pulse 71   Temp 98 F (36.7 C)   Resp 16   SpO2 99%  Physical Exam Vitals and nursing note reviewed.  Constitutional:      General: He is not in acute distress.    Appearance: He is well-developed.  HENT:     Head: Normocephalic and atraumatic.     Mouth/Throat:     Mouth: Mucous membranes are moist.  Eyes:     Conjunctiva/sclera: Conjunctivae normal.  Cardiovascular:     Rate and Rhythm: Normal rate and regular rhythm.     Heart sounds: No murmur heard. Pulmonary:  Effort: Pulmonary effort is normal. No respiratory distress.     Breath sounds: Normal breath sounds.  Abdominal:     Palpations: Abdomen is soft.     Tenderness: There is no abdominal tenderness.  Musculoskeletal:        General: No swelling.     Cervical back: Neck supple.  Skin:    General: Skin is warm and dry.     Capillary Refill: Capillary refill takes less than 2 seconds.  Neurological:     General: No focal deficit present.     Mental Status: He is alert and oriented to person, place, and time.     Cranial Nerves: No cranial nerve deficit.  Psychiatric:        Mood and Affect: Mood normal.     ED Results / Procedures / Treatments    Labs (all labs ordered are listed, but only abnormal results are displayed) Labs Reviewed  CBG MONITORING, ED - Abnormal; Notable for the following components:      Result Value   Glucose-Capillary 307 (*)    All other components within normal limits  CBG MONITORING, ED - Abnormal; Notable for the following components:   Glucose-Capillary 243 (*)    All other components within normal limits    EKG None  Radiology No results found.  Procedures Procedures    Medications Ordered in ED Medications - No data to display  ED Course/ Medical Decision Making/ A&P                           Medical Decision Making  Blood sugars come down on its own to 243.  Patient mostly just concerned that his insulin pump was not functioning properly.  Had a new 1 but needs to be set up.  Whilst here they were able to get a hold of their endocrinologist and they have arranged for the facility in [facility] to contact him tomorrow and set up his new pump remotely.  Patient able to do sliding scale in the meantime as he has been doing.  Patient also eating and drinking fine.  Patient nontoxic no acute distress Final Clinical Impression(s) / ED Diagnoses Final diagnoses:  Hyperglycemia    Rx / DC Orders ED Discharge Orders     None         Fredia Sorrow, MD 09/21/21 1816

## 2021-09-21 NOTE — ED Triage Notes (Signed)
Patient is diabetic and has insulin pump. It has been malfunctioning for 2 weeks and he cant get in touch with MD. He took 8 units novolog last night with no change. His cbg is over 400 . He is feeling light headed today.

## 2021-09-22 NOTE — Progress Notes (Unsigned)
Assessment/Plan:   Tremor  -Suspect may be somewhat multifactorial.  Think the biggest issue here is anxiety and he and I discussed this in detail.  There are some functional aspects to the tremor and it is somewhat distractible.  -Discussed role that Abilify can play in tremor.  He has been on Abilify a long time, but certainly Abilify can produce some degree of tremor.  I think that some of the tremor in the left hand may be from Abilify, but otherwise I saw very little other secondary parkinsonian features.  I did tell him in no uncertain terms that he should not change or alter his Abilify or any other medication without talking to his prescribing physician first.  Both patient and his grandmother expressed understanding.   Subjective:   Danny Werner was seen today in the movement disorders clinic for neurologic consultation at the request of Emelia Loron, NP.  The consultation is for the evaluation of tremor.  Pt with GM who supplements the history.    Tremor: Yes.     How long has it been going on? 1 + years, worse over the last 6 months  At rest or with activation?  both  Fam hx of tremor?  Yes.   Father with tremor  Located where?  Bilateral upper and lower extremities, RUE is the worst.  He is L hand dominant  Affected by caffeine:  No.  Affected by alcohol:  doesn't drink enough to know  Affected by stress:  Yes.  , per GM  Affected by fatigue:  No.  Spills soup if on spoon:  Yes.    Spills glass of liquid if full:  Yes.    Tremor inducing meds:  Yes.   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  Falls?  No. Bradykinesia symptoms: no bradykinesia noted Loss of smell:  No. Loss of taste:  No. Urinary Incontinence:  No. Difficulty Swallowing:  No. Handwriting, micrographia: No. Trouble with ADL's:  No.  Trouble buttoning clothing:  No. Depression:  No. Memory changes:  No. N/V:  No. Lightheaded:  No. (Not unless BS is abnormal - went to ED for it - GM says he does get lightheaded other times)  Syncope: No. Diplopia:  No.   ALLERGIES:   Allergies  Allergen Reactions   Bee Venom Anaphylaxis and Swelling   Codeine Other (See Comments)    seizures   Penicillins Other (See Comments)    Seizures  Has patient had a PCN reaction causing immediate rash, facial/tongue/throat swelling, SOB or lightheadedness with hypotension: No Has patient had a PCN reaction causing severe rash involving mucus membranes or skin necrosis: No Has patient had a PCN reaction that required hospitalization: Yes Has patient had a PCN reaction occurring within the last 10 years: No If all of the above answers are "NO", then may proceed with Cephalosporin use.    Promethazine Hcl Other (See Comments)    Seizures.    CURRENT MEDICATIONS:  Current Outpatient Medications  Medication Instructions   ARIPiprazole (ABILIFY) 15 mg, Oral, Daily   EPINEPHrine (EPI-PEN) 0.3 mg, Once PRN   guanFACINE (INTUNIV) 4 MG TB24 ER tablet 1 tablet, Oral, Nightly   hydrOXYzine (VISTARIL) 25 mg, Oral, 2 times daily   Insulin Disposable Pump (OMNIPOD DASH INTRO, GEN 4,) KIT Change q48 h    insulin lispro (HUMALOG) 100 UNIT/ML  injection Insulin pump   TRULICITY 1.5 BT/5.9RC SOPN 0.5 mLs, Subcutaneous, Daily    Objective:   PHYSICAL EXAMINATION:    VITALS:   Vitals:   09/25/21 1259  BP: 126/62  Pulse: 76  SpO2: 97%  Weight: 150 lb 3.2 oz (68.1 kg)  Height: 5' 4"  (1.626 m)    GEN:  The patient appears stated age and is in NAD. HEENT:  Normocephalic, atraumatic.  The mucous membranes are moist. The superficial temporal arteries are without ropiness or tenderness. CV:  RRR Lungs:  CTAB Neck/HEME:  There are no carotid bruits bilaterally.  Neurological examination:  Orientation: The patient is alert and oriented x3.  Cranial nerves: There is good  facial symmetry.  Extraocular muscles are intact. The visual fields are full to confrontational testing. The speech is fluent and clear. Soft palate rises symmetrically and there is no tongue deviation. Hearing is intact to conversational tone. Sensation: Sensation is intact to light touch throughout (facial, trunk, extremities). Vibration is intact at the bilateral big toe. There is no extinction with double simultaneous stimulation.  Motor: Strength is 5/5 in the bilateral upper and lower extremities.   Shoulder shrug is equal and symmetric.  There is no pronator drift. Deep tendon reflexes: Deep tendon reflexes are 2/4 at the bilateral biceps, triceps, brachioradialis, patella and achilles. Plantar responses are downgoing bilaterally.  Movement examination: Tone: There is normal tone in the bilateral upper extremities.  The tone in the lower extremities is normal.  Abnormal movements: He has tremor in both arms and both legs, but it is somewhat distractible.  The left arm rest tremor is the least distractible.  He has some postural and intention tremor, low amplitude, but there is some entrainment associated with that.  He has mild tremor with Archimedes spirals bilaterally. Coordination:  There is no decremation with RAM's, with any form of RAMS, including alternating supination and pronation of the forearm, hand opening and closing, finger taps, heel taps and toe taps. Gait and Station: The patient has no difficulty arising out of a deep-seated chair without the use of the hands. The patient's stride length is good.  There is no tremor in either arm with ambulation and arm swing is good bilaterally. I have reviewed and interpreted the following labs independently   Chemistry      Component Value Date/Time   NA 133 (L) 05/07/2021 1423   K 3.9 05/07/2021 1423   CL 102 05/07/2021 1423   CO2 21 (L) 05/07/2021 1423   BUN 13 05/07/2021 1423   CREATININE 0.86 05/07/2021 1423      Component Value  Date/Time   CALCIUM 9.5 05/07/2021 1423   ALKPHOS 144 (H) 05/07/2021 1423   AST 17 05/07/2021 1423   ALT 26 05/07/2021 1423   BILITOT 1.0 05/07/2021 1423      Lab Results  Component Value Date   TSH 1.638 05/07/2021   Lab Results  Component Value Date   WBC 6.4 05/07/2021   HGB 17.2 (H) 05/07/2021   HCT 46.7 05/07/2021   MCV 80.1 05/07/2021   PLT 283 05/07/2021      Total time spent on today's visit was 45 minutes, including both face-to-face time and nonface-to-face time.  Time included that spent on review of records (prior notes available to me/labs/imaging if pertinent), discussing treatment and goals, answering patient's questions and coordinating care.  Cc:  Center, [facility]

## 2021-09-25 ENCOUNTER — Ambulatory Visit: Payer: Commercial Managed Care - HMO | Admitting: Neurology

## 2021-09-25 ENCOUNTER — Encounter: Payer: Self-pay | Admitting: Neurology

## 2021-09-25 VITALS — BP 126/62 | HR 76 | Ht 64.0 in | Wt 150.2 lb

## 2021-09-25 DIAGNOSIS — G251 Drug-induced tremor: Secondary | ICD-10-CM

## 2021-09-25 DIAGNOSIS — F419 Anxiety disorder, unspecified: Secondary | ICD-10-CM

## 2021-09-25 NOTE — Patient Instructions (Signed)
We have discussed that your parkinsonian symptoms are due to your abilify medication in addition to the role that anxiety plays.  You need to contact your prescribing physician or provider to discuss what we discussed today.  Do not just stop the medication on your own or alter the medication!    It was my pleasure to see you today!  The physicians and staff at [facility] are committed to providing excellent care. You may receive a survey requesting feedback about your experience at our office. We strive to receive "very good" responses to the survey questions. If you feel that your experience would prevent you from giving the office a "very good " response, please contact our office to try to remedy the situation. We may be reached at [phone]. Thank you for taking the time out of your busy day to complete the survey.

## 2021-10-25 ENCOUNTER — Telehealth: Payer: Self-pay | Admitting: Neurology

## 2021-10-25 NOTE — Telephone Encounter (Signed)
Office notes have been faxed to number provided. Called patients mother and informed her that notes have been faxed.

## 2021-10-25 NOTE — Telephone Encounter (Signed)
Pt's mother called in stating [facility] never got our notes from when they referred the patient and was seen on 09/25/21. The patient passed out last night so they need [place] to fax them the notes at [phone]

## 2021-11-26 ENCOUNTER — Encounter (HOSPITAL_COMMUNITY): Payer: Self-pay

## 2021-11-26 ENCOUNTER — Inpatient Hospital Stay (HOSPITAL_BASED_OUTPATIENT_CLINIC_OR_DEPARTMENT_OTHER)
Admission: EM | Admit: 2021-11-26 | Discharge: 2021-11-28 | DRG: 638 | Disposition: A | Discharge status: Home / Self Care | Payer: Commercial Managed Care - HMO | Attending: Internal Medicine | Admitting: Internal Medicine

## 2021-11-26 ENCOUNTER — Emergency Department (HOSPITAL_BASED_OUTPATIENT_CLINIC_OR_DEPARTMENT_OTHER): Payer: Commercial Managed Care - HMO

## 2021-11-26 ENCOUNTER — Encounter (HOSPITAL_BASED_OUTPATIENT_CLINIC_OR_DEPARTMENT_OTHER): Payer: Self-pay

## 2021-11-26 ENCOUNTER — Other Ambulatory Visit: Payer: Self-pay

## 2021-11-26 DIAGNOSIS — F17290 Nicotine dependence, other tobacco product, uncomplicated: Secondary | ICD-10-CM | POA: Diagnosis present

## 2021-11-26 DIAGNOSIS — Z91030 Bee allergy status: Secondary | ICD-10-CM | POA: Diagnosis not present

## 2021-11-26 DIAGNOSIS — Z9641 Presence of insulin pump (external) (internal): Secondary | ICD-10-CM | POA: Diagnosis present

## 2021-11-26 DIAGNOSIS — Z880 Allergy status to penicillin: Secondary | ICD-10-CM

## 2021-11-26 DIAGNOSIS — Z79899 Other long term (current) drug therapy: Secondary | ICD-10-CM | POA: Diagnosis not present

## 2021-11-26 DIAGNOSIS — F3130 Bipolar disorder, current episode depressed, mild or moderate severity, unspecified: Secondary | ICD-10-CM | POA: Diagnosis present

## 2021-11-26 DIAGNOSIS — E860 Dehydration: Secondary | ICD-10-CM | POA: Diagnosis not present

## 2021-11-26 DIAGNOSIS — E1310 Other specified diabetes mellitus with ketoacidosis without coma: Principal | ICD-10-CM

## 2021-11-26 DIAGNOSIS — F909 Attention-deficit hyperactivity disorder, unspecified type: Secondary | ICD-10-CM | POA: Diagnosis not present

## 2021-11-26 DIAGNOSIS — Z888 Allergy status to other drugs, medicaments and biological substances status: Secondary | ICD-10-CM | POA: Diagnosis not present

## 2021-11-26 DIAGNOSIS — E1110 Type 2 diabetes mellitus with ketoacidosis without coma: Secondary | ICD-10-CM | POA: Diagnosis not present

## 2021-11-26 DIAGNOSIS — K219 Gastro-esophageal reflux disease without esophagitis: Secondary | ICD-10-CM | POA: Diagnosis present

## 2021-11-26 DIAGNOSIS — Z818 Family history of other mental and behavioral disorders: Secondary | ICD-10-CM

## 2021-11-26 DIAGNOSIS — G251 Drug-induced tremor: Secondary | ICD-10-CM | POA: Diagnosis not present

## 2021-11-26 DIAGNOSIS — Z833 Family history of diabetes mellitus: Secondary | ICD-10-CM

## 2021-11-26 DIAGNOSIS — Z885 Allergy status to narcotic agent status: Secondary | ICD-10-CM | POA: Diagnosis not present

## 2021-11-26 DIAGNOSIS — Z7985 Long-term (current) use of injectable non-insulin antidiabetic drugs: Secondary | ICD-10-CM

## 2021-11-26 DIAGNOSIS — Z794 Long term (current) use of insulin: Secondary | ICD-10-CM

## 2021-11-26 DIAGNOSIS — E1010 Type 1 diabetes mellitus with ketoacidosis without coma: Secondary | ICD-10-CM | POA: Diagnosis present

## 2021-11-26 LAB — I-STAT VENOUS BLOOD GAS, ED
Acid-base deficit: 10 mmol/L — ABNORMAL HIGH (ref 0.0–2.0)
Bicarbonate: 14.6 mmol/L — ABNORMAL LOW (ref 20.0–28.0)
Calcium, Ion: 1.35 mmol/L (ref 1.15–1.40)
HCT: 51 % (ref 39.0–52.0)
Hemoglobin: 17.3 g/dL — ABNORMAL HIGH (ref 13.0–17.0)
O2 Saturation: 97 %
Patient temperature: 98.6
Potassium: 3.8 mmol/L (ref 3.5–5.1)
Sodium: 133 mmol/L — ABNORMAL LOW (ref 135–145)
TCO2: 15 mmol/L — ABNORMAL LOW (ref 22–32)
pCO2, Ven: 28.2 mmHg — ABNORMAL LOW (ref 44–60)
pH, Ven: 7.322 (ref 7.25–7.43)
pO2, Ven: 94 mmHg — ABNORMAL HIGH (ref 32–45)

## 2021-11-26 LAB — URINALYSIS, ROUTINE W REFLEX MICROSCOPIC
Bilirubin Urine: NEGATIVE
Glucose, UA: >1000 mg/dL — AB
Hgb urine dipstick: NEGATIVE
Ketones, ur: >80 mg/dL — AB
Leukocytes,Ua: NEGATIVE
Nitrite: NEGATIVE
Protein, ur: 30 mg/dL — AB
Specific Gravity, Urine: 1.034 — ABNORMAL HIGH (ref 1.005–1.030)
pH: 5.5 (ref 5.0–8.0)

## 2021-11-26 LAB — CBC
HCT: 49 % (ref 39.0–52.0)
Hemoglobin: 18 g/dL — ABNORMAL HIGH (ref 13.0–17.0)
MCH: 29.3 pg (ref 26.0–34.0)
MCHC: 36.7 g/dL — ABNORMAL HIGH (ref 30.0–36.0)
MCV: 79.8 fL — ABNORMAL LOW (ref 80.0–100.0)
Platelets: 329 10*3/uL (ref 150–400)
RBC: 6.14 MIL/uL — ABNORMAL HIGH (ref 4.22–5.81)
RDW: 12.9 % (ref 11.5–15.5)
WBC: 9 10*3/uL (ref 4.0–10.5)
nRBC: 0 % (ref 0.0–0.2)

## 2021-11-26 LAB — BETA-HYDROXYBUTYRIC ACID
Beta-Hydroxybutyric Acid: 5.46 mmol/L — ABNORMAL HIGH (ref 0.05–0.27)
Beta-Hydroxybutyric Acid: 1.81 mmol/L — ABNORMAL HIGH (ref 0.05–0.27)

## 2021-11-26 LAB — BASIC METABOLIC PANEL
Anion gap: 21 — ABNORMAL HIGH (ref 5–15)
Anion gap: 14 (ref 5–15)
Anion gap: 9 (ref 5–15)
BUN: 14 mg/dL (ref 6–20)
BUN: 9 mg/dL (ref 6–20)
BUN: 9 mg/dL (ref 6–20)
CO2: 15 mmol/L — ABNORMAL LOW (ref 22–32)
CO2: 18 mmol/L — ABNORMAL LOW (ref 22–32)
CO2: 22 mmol/L (ref 22–32)
Calcium: 9.9 mg/dL (ref 8.9–10.3)
Calcium: 9.3 mg/dL (ref 8.9–10.3)
Calcium: 9.5 mg/dL (ref 8.9–10.3)
Chloride: 97 mmol/L — ABNORMAL LOW (ref 98–111)
Chloride: 105 mmol/L (ref 98–111)
Chloride: 110 mmol/L (ref 98–111)
Creatinine, Ser: 0.92 mg/dL (ref 0.61–1.24)
Creatinine, Ser: 0.68 mg/dL (ref 0.61–1.24)
Creatinine, Ser: 0.65 mg/dL (ref 0.61–1.24)
GFR, Estimated: >60 mL/min (ref >60)
GFR, Estimated: >60 mL/min (ref >60)
GFR, Estimated: >60 mL/min (ref >60)
Glucose, Bld: 308 mg/dL — ABNORMAL HIGH (ref 70–99)
Glucose, Bld: 201 mg/dL — ABNORMAL HIGH (ref 70–99)
Glucose, Bld: 180 mg/dL — ABNORMAL HIGH (ref 70–99)
Potassium: 4.2 mmol/L (ref 3.5–5.1)
Potassium: 3.5 mmol/L (ref 3.5–5.1)
Potassium: 3.4 mmol/L — ABNORMAL LOW (ref 3.5–5.1)
Sodium: 133 mmol/L — ABNORMAL LOW (ref 135–145)
Sodium: 137 mmol/L (ref 135–145)
Sodium: 141 mmol/L (ref 135–145)

## 2021-11-26 LAB — GLUCOSE, CAPILLARY
Glucose-Capillary: 175 mg/dL — ABNORMAL HIGH (ref 70–99)
Glucose-Capillary: 210 mg/dL — ABNORMAL HIGH (ref 70–99)
Glucose-Capillary: 129 mg/dL — ABNORMAL HIGH (ref 70–99)
Glucose-Capillary: 144 mg/dL — ABNORMAL HIGH (ref 70–99)

## 2021-11-26 LAB — CBG MONITORING, ED
Glucose-Capillary: 339 mg/dL — ABNORMAL HIGH (ref 70–99)
Glucose-Capillary: 286 mg/dL — ABNORMAL HIGH (ref 70–99)
Glucose-Capillary: 198 mg/dL — ABNORMAL HIGH (ref 70–99)
Glucose-Capillary: 243 mg/dL — ABNORMAL HIGH (ref 70–99)
Glucose-Capillary: 203 mg/dL — ABNORMAL HIGH (ref 70–99)

## 2021-11-26 LAB — MRSA NEXT GEN BY PCR, NASAL: MRSA by PCR Next Gen: NOT DETECTED

## 2021-11-26 MED ORDER — LACTATED RINGERS IV SOLN: INTRAVENOUS | Status: DC

## 2021-11-26 MED ORDER — GUANFACINE HCL ER 1 MG PO TB24
4.0000 mg | ORAL_TABLET | Freq: Every day | ORAL | Status: DC
  Administered 2021-11-26 – 2021-11-27 (×2): 4 mg via ORAL
  Filled 2021-11-26 (×2): qty 4

## 2021-11-26 MED ORDER — LACTATED RINGERS IV SOLN
INTRAVENOUS | Status: DC
Start: 2021-11-26 — End: 2021-11-27

## 2021-11-26 MED ORDER — ORAL CARE MOUTH RINSE: 15.0000 mL | OROMUCOSAL | Status: DC | PRN

## 2021-11-26 MED ORDER — DEXTROSE IN LACTATED RINGERS 5 % IV SOLN: INTRAVENOUS | Status: DC

## 2021-11-26 MED ORDER — ONDANSETRON HCL 4 MG/2ML IJ SOLN: 4.0000 mg | Freq: Four times a day (QID) | INTRAMUSCULAR | Status: DC | PRN

## 2021-11-26 MED ORDER — HYDROXYZINE PAMOATE 25 MG PO CAPS
25.0000 mg | ORAL_CAPSULE | Freq: Two times a day (BID) | ORAL | Status: DC
  Filled 2021-11-26: qty 1

## 2021-11-26 MED ORDER — DEXTROSE 50 % IV SOLN: 0.0000 mL | INTRAVENOUS | Status: DC | PRN

## 2021-11-26 MED ORDER — ARIPIPRAZOLE 10 MG PO TABS: 20.0000 mg | ORAL_TABLET | Freq: Every day | ORAL | Status: DC

## 2021-11-26 MED ORDER — LACTATED RINGERS IV BOLUS
20.0000 mL/kg | Freq: Once | INTRAVENOUS | Status: AC
  Administered 2021-11-26: 1362 mL via INTRAVENOUS

## 2021-11-26 MED ORDER — ARIPIPRAZOLE 5 MG PO TABS: 15.0000 mg | ORAL_TABLET | Freq: Every day | ORAL | Status: DC

## 2021-11-26 MED ORDER — ACETAMINOPHEN 325 MG PO TABS: 650.0000 mg | ORAL_TABLET | Freq: Four times a day (QID) | ORAL | Status: DC | PRN

## 2021-11-26 MED ORDER — ONDANSETRON HCL 4 MG PO TABS: 4.0000 mg | ORAL_TABLET | Freq: Four times a day (QID) | ORAL | Status: DC | PRN

## 2021-11-26 MED ORDER — CHLORHEXIDINE GLUCONATE CLOTH 2 % EX PADS
6.0000 | MEDICATED_PAD | Freq: Every day | CUTANEOUS | Status: DC
  Administered 2021-11-26 – 2021-11-27 (×2): 6 via TOPICAL

## 2021-11-26 MED ORDER — ENOXAPARIN SODIUM 40 MG/0.4ML IJ SOSY
40.0000 mg | PREFILLED_SYRINGE | Freq: Every day | INTRAMUSCULAR | Status: DC
  Administered 2021-11-26 – 2021-11-27 (×2): 40 mg via SUBCUTANEOUS
  Filled 2021-11-26 (×2): qty 0.4

## 2021-11-26 MED ORDER — INSULIN REGULAR(HUMAN) IN NACL 100-0.9 UT/100ML-% IV SOLN
INTRAVENOUS | Status: DC
  Administered 2021-11-26: 9 [IU]/h via INTRAVENOUS
  Filled 2021-11-26: qty 100

## 2021-11-26 MED ORDER — ACETAMINOPHEN 650 MG RE SUPP: 650.0000 mg | Freq: Four times a day (QID) | RECTAL | Status: DC | PRN

## 2021-11-26 MED ORDER — ARIPIPRAZOLE 10 MG PO TABS
20.0000 mg | ORAL_TABLET | Freq: Every day | ORAL | Status: DC
  Administered 2021-11-26 – 2021-11-27 (×2): 20 mg via ORAL
  Filled 2021-11-26 (×2): qty 2

## 2021-11-26 MED ORDER — PANTOPRAZOLE SODIUM 40 MG IV SOLR
40.0000 mg | INTRAVENOUS | Status: DC
  Administered 2021-11-26 – 2021-11-27 (×2): 40 mg via INTRAVENOUS
  Filled 2021-11-26 (×2): qty 10

## 2021-11-26 MED ORDER — POTASSIUM CHLORIDE 10 MEQ/100ML IV SOLN
10.0000 meq | INTRAVENOUS | Status: AC
  Administered 2021-11-26 (×2): 10 meq via INTRAVENOUS
  Filled 2021-11-26 (×2): qty 100

## 2021-11-26 MED ORDER — SODIUM CHLORIDE 0.9 % IV BOLUS: 1000.0000 mL | Freq: Once | INTRAVENOUS | Status: DC

## 2021-11-26 MED ORDER — HYDROXYZINE HCL 25 MG PO TABS
25.0000 mg | ORAL_TABLET | Freq: Two times a day (BID) | ORAL | Status: DC
Start: 2021-11-26 — End: 2021-11-28
  Administered 2021-11-26 – 2021-11-28 (×4): 25 mg via ORAL
  Filled 2021-11-26 (×5): qty 1

## 2021-11-26 MED ORDER — NICOTINE 21 MG/24HR TD PT24
21.0000 mg | MEDICATED_PATCH | Freq: Every day | TRANSDERMAL | Status: DC
  Administered 2021-11-27 – 2021-11-28 (×3): 21 mg via TRANSDERMAL
  Filled 2021-11-26 (×3): qty 1

## 2021-11-26 MED ORDER — POLYETHYLENE GLYCOL 3350 17 G PO PACK: 17.0000 g | PACK | Freq: Every day | ORAL | Status: DC | PRN

## 2021-11-26 NOTE — Assessment & Plan Note (Signed)
   Ongoing resting tremor of the left upper extremity  Patient has been evaluated by neurology in the outpatient setting who believes that this may be an Abilify induced tremor   Review of the last neurology note note they mentioned that they wish to continue Abilify for now

## 2021-11-26 NOTE — Progress Notes (Signed)
26 year old male with recently diagnosed DM1, history of DKA Presented to the ED with complaint of nausea, vomiting, lightheadedness and near syncope PCP at [facility], on insulin pump  Blood sugar level was elevated BG308, CO2 15, pH 7.32  Early DKA -unclear etiology.  No evidence of infection.  Insulin drip, IV fluid. Electrolyte monitoring, serial BMP Accepted to progressive bed

## 2021-11-26 NOTE — Assessment & Plan Note (Signed)
   Patient presenting with anion gap metabolic acidosis, severe hyperglycemia and ketonuria all suggestive of diabetic ketoacidosis  Beta-hydroxybutyrate pending,   Progressively worsening glycemic control is likely the precipitating event  Patient's been placed on insulin infusion  Patient is being hydrated aggressively with intravenous isotonic fluids  Performing serial chemistries and serial beta hydroxybutyrate levels  Patient is currently n.p.o. with exception of ice chips and sips of water  Patient will remain on insulin infusion until anion gap is closed

## 2021-11-26 NOTE — ED Notes (Signed)
Pt's CBG result was 198. Informed Cortney - RN.

## 2021-11-26 NOTE — Assessment & Plan Note (Signed)
   Patient complains of ongoing heartburn on a nearly daily basis requiring frequent doses of as needed Tums  Treating intravenous Protonix for now  Patient would likely benefit from daily oral Protonix therapy at time of discharge.

## 2021-11-26 NOTE — Plan of Care (Signed)

## 2021-11-26 NOTE — Assessment & Plan Note (Signed)
   Continue home regimen of psychotropic therapy 

## 2021-11-26 NOTE — ED Notes (Signed)
RT note: Venous blood obtained by RN for ordered VBG with results given to MD, transferred to chart.

## 2021-11-26 NOTE — ED Triage Notes (Signed)
Patient here POV from Home.  Endorses Near Syncope and Nausea since Last PM. Emesis this AM. Endorses Full Episodes of LOC recently (Last Episode 2 Weeks ago).   No Pain. No SOB. No Fevers or Diarrhea.  NAD Noted during Triage. A&Ox4. GCS 15. Ambulatory.

## 2021-11-26 NOTE — Progress Notes (Addendum)
Pt was transferred with a stretcher to the floor; oriented to room.

## 2021-11-26 NOTE — H&P (Signed)
History and Physical    Patient: Danny Werner MRN: [id number] DOA: 11/26/2021  Date of Service: the patient was seen and examined on 11/26/2021  Patient coming from: Home via [facility]  Chief Complaint:  Chief Complaint  Patient presents with   Near Syncope    HPI:   26 year old male with past medical history of insulin-dependent diabetes mellitus type 2, bipolar disorder, drug-induced tremor, ADHD who presents to [facility] emergency department with complaints of lightheadedness nausea and vomiting.  Patient explains that for at least the past month he has been experiencing extremely high blood sugars in excess of 300.  Patient reports that this has been occurring despite using his Omni pod insulin pump as instructed.  As patient's extremely high blood sugars have persisted patient has developed progressively worsening generalized malaise and weakness.  Patient has noticed that he is always thirsty drinking numerous bottles of water daily which have been unable to quench his thirst.  Upon further questioning patient denies fevers, sick contacts, diarrhea, dysuria, shortness of breath, cough or contacts with confirmed COVID-19 infection.  Patient reports that the morning of 8/13 he felt extremely nauseated and vomited several times.  Vomitus was nonbilious and nonbloody.  Patient attempted to mow his own lawn later in the day but a time going outside and began to push the lawnmower he felt extremely lightheaded and nearly passed out.  This is what prompted the patient to present to [facility] emergency department for evaluation.  Upon evaluation in the emergency department initial labs were suggestive of diabetic ketoacidosis.  Patient was initiated on intravenous fluids and an insulin infusion.  The hospitalist group was contacted and patient was excepted for transfer to [facility] long hospital for continued medical care.   Review of Systems: Review of Systems  Constitutional:   Positive for malaise/fatigue.  Gastrointestinal:  Positive for heartburn, nausea and vomiting.  Neurological:  Positive for dizziness.  Endo/Heme/Allergies:  Positive for polydipsia.  All other systems reviewed and are negative.    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   Depression    Depression    Diabetes mellitus without complication ([place])    Diabetes mellitus, type II ([place])    Intermittent explosive disorder    Migraines    Trauma    Tumor    left shoulfer    Past Surgical History:  Procedure Laterality Date   ADENOIDECTOMY     ANAL RECTAL MANOMETRY N/A 01/18/2016   Procedure: ANO RECTAL MANOMETRY;  Surgeon: Arta Silence, MD;  Location: [facility] ENDOSCOPY;  Service: Endoscopy;  Laterality: N/A;   FRACTURE SURGERY     MOUTH SURGERY     TONSILLECTOMY     TUMOR REMOVAL Left    shoulder-removed age 67    Social History:  reports that he has been smoking e-cigarettes. He has never used smokeless tobacco. He reports that he does not drink alcohol and does not use drugs.  Allergies  Allergen Reactions   Bee Venom Anaphylaxis and Swelling   Codeine Other (See Comments)    Seizures    Penicillins Other (See Comments)    Seizures  Has patient had a PCN reaction causing immediate rash, facial/tongue/throat swelling, SOB or lightheadedness with hypotension: No Has patient had a PCN reaction causing severe rash involving mucus membranes or skin necrosis: No Has  patient had a PCN reaction that required hospitalization: Yes Has patient had a PCN reaction occurring within the last 10 years: No If all of the above answers are "NO", then may proceed with Cephalosporin use.    Promethazine Hcl Other (See Comments)    Seizures    Family History  Problem Relation Age of Onset   Asthma Mother    Depression Father    Bipolar disorder Father    Depression  Paternal Aunt    Depression Maternal Uncle    Depression Paternal Grandfather    Dementia Paternal Grandmother    Depression Paternal Grandmother    Depression Cousin    Diabetes Neg Hx     Prior to Admission medications   Medication Sig Start Date End Date Taking? Authorizing Provider  ARIPiprazole (ABILIFY) 15 MG tablet Take 1 tablet (15 mg total) by mouth daily. 05/23/21   Derrill Center, NP  EPINEPHrine 0.3 mg/0.3 mL IJ SOAJ injection Inject 0.3 mg into the skin once as needed for anaphylaxis. Inject into the middle of the outer thigh and hold for 3 seconds as needed for severe allergic reaction then call 911 if used. Patient not taking: Reported on 09/25/2021 10/18/20   [provider]  guanFACINE (INTUNIV) 4 MG TB24 ER tablet Take 1 tablet by mouth at bedtime. 03/28/21   [provider]  hydrOXYzine (VISTARIL) 25 MG capsule Take 25 mg by mouth 2 (two) times daily. 09/07/21   [provider]  Insulin Disposable Pump (OMNIPOD DASH INTRO, GEN 4,) KIT Change q48 h 03/01/21   [provider]  insulin lispro (HUMALOG) 100 UNIT/ML injection Insulin pump 04/27/21   [provider]  TRULICITY 1.5 DU/4.3CV SOPN Inject 0.5 mLs into the skin daily. 11/30/20   [provider]    Physical Exam:  Vitals:   11/26/21 1900 11/26/21 1915 11/26/21 1930 11/26/21 1949  BP: 116/60 116/72 121/67   Pulse: 87 75 (!) 101   Resp: 20 16 17    Temp:    98 F (36.7 C)  TempSrc:    Oral  SpO2: 97% 98% 97%   Weight:      Height:        Constitutional: Awake alert and oriented x3, no associated distress.   Skin: no rashes, no lesions, poor skin turgor noted. Eyes: Pupils are equally reactive to light.  No evidence of scleral icterus or conjunctival pallor.  ENMT: Poor dentition noted.  Dry mucous membranes noted.  Posterior pharynx clear of any exudate or lesions.   Neck: normal, supple, no masses, no thyromegaly.  No evidence of jugular venous distension.    Respiratory: clear to auscultation bilaterally, no wheezing, no crackles. Normal respiratory effort. No accessory muscle use.  Cardiovascular: Regular rate and rhythm, no murmurs / rubs / gallops. No extremity edema. 2+ pedal pulses. No carotid bruits.  Chest:   Nontender without crepitus or deformity.   Back:   Nontender without crepitus or deformity. Abdomen: Mild epigastric tenderness otherwise abdomen is soft nontender.  No evidence of intra-abdominal masses.  Positive bowel sounds noted in all quadrants.   Musculoskeletal: No joint deformity upper and lower extremities. Good ROM, no contractures. Normal muscle tone.  Neurologic: Resting tremor of the left upper extremity.  CN 2-12 grossly intact. Sensation intact.  Patient moving all 4 extremities spontaneously.  Patient is following all commands.  Patient is responsive to verbal stimuli.   Psychiatric: Patient exhibits normal mood with flat affect.  Patient seems to possess insight  as to their current situation.    Data Reviewed:  I have personally reviewed and interpreted labs, imaging.  Significant findings are:  Chemistry reveals sodium 133, Potassium 4.2, chloride 97, bicarbonate 15, BUN of 14, creatinine 0.92, glucose 308  CBC revealing white blood cell count of 9.0, hemoglobin 18, hematocrit 49, platelet count of 329.   Urinalysis revealing glucose of greater than 1000, specific gravity 1.034, ketones greater than 80 ABG reveals pH of 7.32, PCO2 of 28 CXR:  Chest X-ray was personally reviewed.  No evidence of focal infiltrates.  No evidence of pleural effusion.  No evidence of pneumothorax.    EKG: Personally reviewed.  Rhythm is normal sinus rhythm with heart rate of 83 bpm.  No dynamic ST segment changes appreciated.   Assessment and Plan: * Type 2 diabetes mellitus with ketoacidosis without coma, with long-term current use of insulin ([place]) Patient presenting with anion gap metabolic acidosis, severe hyperglycemia and  ketonuria all suggestive of diabetic ketoacidosis Beta-hydroxybutyrate pending,  Progressively worsening glycemic control is likely the precipitating event Patient's been placed on insulin infusion Patient is being hydrated aggressively with intravenous isotonic fluids Performing serial chemistries and serial beta hydroxybutyrate levels Patient is currently n.p.o. with exception of ice chips and sips of water Patient will remain on insulin infusion until anion gap is closed    Bipolar I disorder, most recent episode depressed ([place]) Continue home regimen of psychotropic therapy.  Drug-induced tremor Ongoing resting tremor of the left upper extremity Patient has been evaluated by neurology in the outpatient setting who believes that this may be an Abilify induced tremor  Review of the last neurology note note they mentioned that they wish to continue Abilify for now   GERD without esophagitis Patient complains of ongoing heartburn on a nearly daily basis requiring frequent doses of as needed Tums Treating intravenous Protonix for now Patient would likely benefit from daily oral Protonix therapy at time of discharge.       Code Status:  Full code  code status decision has been confirmed with: patient Family Communication: deferred   Consults: none  Severity of Illness:  The appropriate patient status for this patient is OBSERVATION. Observation status is judged to be reasonable and necessary in order to provide the required intensity of service to ensure the patient's safety. The patient's presenting symptoms, physical exam findings, and initial radiographic and laboratory data in the context of their medical condition is felt to place them at decreased risk for further clinical deterioration. Furthermore, it is anticipated that the patient will be medically stable for discharge from the hospital within 2 midnights of admission.   Author:  Vernelle Emerald MD  11/26/2021 9:56  PM

## 2021-11-26 NOTE — ED Notes (Signed)
PT's CBG resulted as 243. Informed Cortney- RN.

## 2021-11-26 NOTE — ED Provider Notes (Signed)
[facility] EMERGENCY DEPT Provider Note   CSN: [id number] Arrival date & time: 11/26/21  1236     History  Chief Complaint  Patient presents with   Near Syncope    Danny Werner is a 26 y.o. male.  HPI 26 yo male ho dm with dka presents with hyperglycemia, nausea and vomiting, and lightheadednes with near syncope. No fever, no chest pain, no uri symptoms, no abdominal pain, Some increased frequency of urination PMD bethany On insulin pump humalog? Patient gives aditional 4-5 /day      Home Medications Prior to Admission medications   Medication Sig Start Date End Date Taking? Authorizing Provider  ARIPiprazole (ABILIFY) 15 MG tablet Take 1 tablet (15 mg total) by mouth daily. 05/23/21   Derrill Center, NP  EPINEPHrine 0.3 mg/0.3 mL IJ SOAJ injection Inject 0.3 mg into the skin once as needed for anaphylaxis. Inject into the middle of the outer thigh and hold for 3 seconds as needed for severe allergic reaction then call 911 if used. Patient not taking: Reported on 09/25/2021 10/18/20   [provider]  guanFACINE (INTUNIV) 4 MG TB24 ER tablet Take 1 tablet by mouth at bedtime. 03/28/21   [provider]  hydrOXYzine (VISTARIL) 25 MG capsule Take 25 mg by mouth 2 (two) times daily. 09/07/21   [provider]  Insulin Disposable Pump (OMNIPOD DASH INTRO, GEN 4,) KIT Change q48 h 03/01/21   [provider]  insulin lispro (HUMALOG) 100 UNIT/ML injection Insulin pump 04/27/21   [provider]  TRULICITY 1.5 ZR/0.0TM SOPN Inject 0.5 mLs into the skin daily. 11/30/20   [provider]      Allergies    Bee venom, Codeine, Penicillins, and Promethazine hcl    Review of Systems   Review of Systems  Physical Exam Updated Vital Signs BP 128/69   Pulse 80   Temp 99.2 F (37.3 C) (Oral)   Resp 18   Ht 1.626 m (5' 4" )   Wt 68.1 kg   SpO2 95%   BMI 25.77 kg/m  Physical Exam Vitals and nursing note reviewed.   Constitutional:      Appearance: Normal appearance.  HENT:     Head: Normocephalic.     Right Ear: External ear normal.     Left Ear: External ear normal.     Nose: Nose normal.     Mouth/Throat:     Mouth: Mucous membranes are dry.     Pharynx: Oropharynx is clear.  Eyes:     Extraocular Movements: Extraocular movements intact.     Pupils: Pupils are equal, round, and reactive to light.  Cardiovascular:     Rate and Rhythm: Normal rate and regular rhythm.     Pulses: Normal pulses.  Pulmonary:     Effort: Pulmonary effort is normal.  Abdominal:     General: Abdomen is flat. Bowel sounds are normal.     Palpations: Abdomen is soft.  Musculoskeletal:        General: Normal range of motion.     Cervical back: Normal range of motion.  Skin:    General: Skin is warm.  Neurological:     General: No focal deficit present.     Mental Status: He is alert.  Psychiatric:        Mood and Affect: Mood normal.        Behavior: Behavior normal.     ED Results / Procedures / Treatments   Labs (all labs  ordered are listed, but only abnormal results are displayed) Labs Reviewed  BASIC METABOLIC PANEL - Abnormal; Notable for the following components:      Result Value   Sodium 133 (*)    Chloride 97 (*)    CO2 15 (*)    Glucose, Bld 308 (*)    Anion gap 21 (*)    All other components within normal limits  CBC - Abnormal; Notable for the following components:   RBC 6.14 (*)    Hemoglobin 18.0 (*)    MCV 79.8 (*)    MCHC 36.7 (*)    All other components within normal limits  URINALYSIS, ROUTINE W REFLEX MICROSCOPIC - Abnormal; Notable for the following components:   Specific Gravity, Urine 1.034 (*)    Glucose, UA >1,000 (*)    Ketones, ur >80 (*)    Protein, ur 30 (*)    All other components within normal limits  CBG MONITORING, ED - Abnormal; Notable for the following components:   Glucose-Capillary 339 (*)    All other components within normal limits  CBG MONITORING, ED  - Abnormal; Notable for the following components:   Glucose-Capillary 286 (*)    All other components within normal limits  I-STAT VENOUS BLOOD GAS, ED - Abnormal; Notable for the following components:   pCO2, Ven 28.2 (*)    pO2, Ven 94 (*)    Bicarbonate 14.6 (*)    TCO2 15 (*)    Acid-base deficit 10.0 (*)    Sodium 133 (*)    Hemoglobin 17.3 (*)    All other components within normal limits  BETA-HYDROXYBUTYRIC ACID  CBG MONITORING, ED    EKG EKG Interpretation  Date/Time:  Sunday November 26 2021 12:59:22 EDT Ventricular Rate:  73 PR Interval:  130 QRS Duration: 96 QT Interval:  358 QTC Calculation: 394 R Axis:   64 Text Interpretation: Normal sinus rhythm Normal ECG When compared with ECG of 07-May-2021 14:31, T wave inversion no longer evident in Inferior leads T wave inversion no longer evident in Anterior leads Confirmed by Pattricia Boss [phone]) on 11/26/2021 4:14:47 PM  Radiology DG Chest 1 View  Result Date: 11/26/2021 CLINICAL DATA:  Weakness and syncopal episodes. EXAM: CHEST  1 VIEW COMPARISON:  Chest radiograph November 12, 2008 FINDINGS: The heart size and mediastinal contours are within normal limits. No focal airspace consolidation. No visible pleural effusion or pneumothorax. The visualized skeletal structures are unremarkable. IMPRESSION: No acute cardiopulmonary disease. Electronically Signed   By: Dahlia Bailiff M.D.   On: 11/26/2021 15:38    Procedures Procedures    Medications Ordered in ED Medications  sodium chloride 0.9 % bolus 1,000 mL (has no administration in time range)  insulin regular, human (MYXREDLIN) 100 units/ 100 mL infusion (9 Units/hr Intravenous New Bag/Given 11/26/21 1549)  lactated ringers infusion ( Intravenous New Bag/Given 11/26/21 1609)  dextrose 5 % in lactated ringers infusion (has no administration in time range)  dextrose 50 % solution 0-50 mL (has no administration in time range)  potassium chloride 10 mEq in 100 mL IVPB (10 mEq  Intravenous New Bag/Given 11/26/21 1610)  lactated ringers bolus 1,362 mL (1,362 mLs Intravenous New Bag/Given 11/26/21 1546)    ED Course/ Medical Decision Making/ A&P Clinical Course as of 11/26/21 1644  Sun Nov 26, 2021  1505 Bs elevated at 308, co2 15  [DR]  6712 Basic metabolic panel reviewed interpreted and significant for blood sugar of 308 and other metabolic abnormalities consistent with DKA  with anion gap elevated at 21 [DR]  1615 CBC reviewed interpreted no leukocytosis noted Hemoglobin elevated 18 consistent with some volume depletion [DR]  1615 Urinalysis obtained consistent with hyperglycemia and DKA with glucose greater than thousand and ketones greater than 80 [DR]  1615 VBG significant for mild acidosis with pH 7.32 PCO2 of 28 bicarb of 14 [DR]    Clinical Course User Index [DR] Pattricia Boss, MD                           Medical Decision Making 26 year old male with history of type I and type 2 diabetes, DKA, presents today with nausea vomiting, hyperglycemia, anion gap, tachycardia, and near syncope.  Patient states that he has had nausea and vomiting today and has been able to keep things down.  He did notice his blood sugars have been running higher over the past several days.  Normally they run around 1 50-200 been over 300 today. Patient appears somewhat volume directed on my exam with dry mucous membranes and mild tachycardia Labs reveal significant anion gap at 21, blood sugar 300, CO2 15 Patient is being given IV fluids and started on DKA protocol with insulin and fluids Care discussed with Dr. Rance Muir who accepts patient  Amount and/or Complexity of Data Reviewed Labs: ordered. Decision-making details documented in ED Course. Radiology: ordered and independent interpretation performed. Decision-making details documented in ED Course. ECG/medicine tests: ordered and independent interpretation performed. Decision-making details documented in ED Course.    Details:  Patient maintained on cardiac monitor Frequent cycling of blood pressure cuff with normal blood pressure Discussion of management or test interpretation with external provider(s): Patient maintained on monitor Patient given IV fluid bolus and has an insulin drip in place Patient has been reassessed multiple times and her heart rate is improved from over 100 to 80s  Risk Prescription drug management. Decision regarding hospitalization.  Critical Care Total time providing critical care: 45 minutes           Final Clinical Impression(s) / ED Diagnoses Final diagnoses:  None    Rx / DC Orders ED Discharge Orders     None         Pattricia Boss, MD 11/26/21 1644

## 2021-11-27 DIAGNOSIS — F909 Attention-deficit hyperactivity disorder, unspecified type: Secondary | ICD-10-CM | POA: Diagnosis present

## 2021-11-27 DIAGNOSIS — E1010 Type 1 diabetes mellitus with ketoacidosis without coma: Secondary | ICD-10-CM | POA: Diagnosis present

## 2021-11-27 DIAGNOSIS — Z9641 Presence of insulin pump (external) (internal): Secondary | ICD-10-CM | POA: Diagnosis present

## 2021-11-27 DIAGNOSIS — Z880 Allergy status to penicillin: Secondary | ICD-10-CM | POA: Diagnosis not present

## 2021-11-27 DIAGNOSIS — Z91030 Bee allergy status: Secondary | ICD-10-CM | POA: Diagnosis not present

## 2021-11-27 DIAGNOSIS — Z888 Allergy status to other drugs, medicaments and biological substances status: Secondary | ICD-10-CM | POA: Diagnosis not present

## 2021-11-27 DIAGNOSIS — Z833 Family history of diabetes mellitus: Secondary | ICD-10-CM | POA: Diagnosis not present

## 2021-11-27 DIAGNOSIS — E1110 Type 2 diabetes mellitus with ketoacidosis without coma: Secondary | ICD-10-CM | POA: Diagnosis not present

## 2021-11-27 DIAGNOSIS — Z818 Family history of other mental and behavioral disorders: Secondary | ICD-10-CM | POA: Diagnosis not present

## 2021-11-27 DIAGNOSIS — G251 Drug-induced tremor: Secondary | ICD-10-CM | POA: Diagnosis present

## 2021-11-27 DIAGNOSIS — Z79899 Other long term (current) drug therapy: Secondary | ICD-10-CM | POA: Diagnosis not present

## 2021-11-27 DIAGNOSIS — K219 Gastro-esophageal reflux disease without esophagitis: Secondary | ICD-10-CM | POA: Diagnosis present

## 2021-11-27 DIAGNOSIS — Z7985 Long-term (current) use of injectable non-insulin antidiabetic drugs: Secondary | ICD-10-CM | POA: Diagnosis not present

## 2021-11-27 DIAGNOSIS — F17290 Nicotine dependence, other tobacco product, uncomplicated: Secondary | ICD-10-CM | POA: Diagnosis present

## 2021-11-27 DIAGNOSIS — F3130 Bipolar disorder, current episode depressed, mild or moderate severity, unspecified: Secondary | ICD-10-CM | POA: Diagnosis present

## 2021-11-27 DIAGNOSIS — Z794 Long term (current) use of insulin: Secondary | ICD-10-CM | POA: Diagnosis not present

## 2021-11-27 DIAGNOSIS — Z885 Allergy status to narcotic agent status: Secondary | ICD-10-CM | POA: Diagnosis not present

## 2021-11-27 DIAGNOSIS — E860 Dehydration: Secondary | ICD-10-CM | POA: Diagnosis present

## 2021-11-27 LAB — CBC WITH DIFFERENTIAL/PLATELET
Abs Immature Granulocytes: 0.09 10*3/uL — ABNORMAL HIGH (ref 0.00–0.07)
Basophils Absolute: 0.1 10*3/uL (ref 0.0–0.1)
Basophils Relative: 2 %
Eosinophils Absolute: 0.2 10*3/uL (ref 0.0–0.5)
Eosinophils Relative: 3 %
HCT: 40.7 % (ref 39.0–52.0)
Hemoglobin: 14.9 g/dL (ref 13.0–17.0)
Immature Granulocytes: 2 %
Lymphocytes Relative: 39 %
Lymphs Abs: 2.3 10*3/uL (ref 0.7–4.0)
MCH: 29.2 pg (ref 26.0–34.0)
MCHC: 36.6 g/dL — ABNORMAL HIGH (ref 30.0–36.0)
MCV: 79.8 fL — ABNORMAL LOW (ref 80.0–100.0)
Monocytes Absolute: 0.6 10*3/uL (ref 0.1–1.0)
Monocytes Relative: 11 %
Neutro Abs: 2.7 10*3/uL (ref 1.7–7.7)
Neutrophils Relative %: 43 %
Platelets: 293 10*3/uL (ref 150–400)
RBC: 5.1 MIL/uL (ref 4.22–5.81)
RDW: 12.9 % (ref 11.5–15.5)
WBC: 5.9 10*3/uL (ref 4.0–10.5)
nRBC: 0 % (ref 0.0–0.2)

## 2021-11-27 LAB — BASIC METABOLIC PANEL
Anion gap: 7 (ref 5–15)
Anion gap: 7 (ref 5–15)
BUN: 10 mg/dL (ref 6–20)
BUN: 10 mg/dL (ref 6–20)
CO2: 22 mmol/L (ref 22–32)
CO2: 24 mmol/L (ref 22–32)
Calcium: 9 mg/dL (ref 8.9–10.3)
Calcium: 9.4 mg/dL (ref 8.9–10.3)
Chloride: 110 mmol/L (ref 98–111)
Chloride: 109 mmol/L (ref 98–111)
Creatinine, Ser: 0.61 mg/dL (ref 0.61–1.24)
Creatinine, Ser: 0.76 mg/dL (ref 0.61–1.24)
GFR, Estimated: >60 mL/min (ref >60)
GFR, Estimated: >60 mL/min (ref >60)
Glucose, Bld: 182 mg/dL — ABNORMAL HIGH (ref 70–99)
Glucose, Bld: 159 mg/dL — ABNORMAL HIGH (ref 70–99)
Potassium: 3.2 mmol/L — ABNORMAL LOW (ref 3.5–5.1)
Potassium: 3.4 mmol/L — ABNORMAL LOW (ref 3.5–5.1)
Sodium: 139 mmol/L (ref 135–145)
Sodium: 140 mmol/L (ref 135–145)

## 2021-11-27 LAB — GLUCOSE, CAPILLARY
Glucose-Capillary: 158 mg/dL — ABNORMAL HIGH (ref 70–99)
Glucose-Capillary: 161 mg/dL — ABNORMAL HIGH (ref 70–99)
Glucose-Capillary: 166 mg/dL — ABNORMAL HIGH (ref 70–99)
Glucose-Capillary: 169 mg/dL — ABNORMAL HIGH (ref 70–99)
Glucose-Capillary: 170 mg/dL — ABNORMAL HIGH (ref 70–99)
Glucose-Capillary: 174 mg/dL — ABNORMAL HIGH (ref 70–99)
Glucose-Capillary: 178 mg/dL — ABNORMAL HIGH (ref 70–99)
Glucose-Capillary: 180 mg/dL — ABNORMAL HIGH (ref 70–99)
Glucose-Capillary: 182 mg/dL — ABNORMAL HIGH (ref 70–99)
Glucose-Capillary: 202 mg/dL — ABNORMAL HIGH (ref 70–99)
Glucose-Capillary: 214 mg/dL — ABNORMAL HIGH (ref 70–99)
Glucose-Capillary: 214 mg/dL — ABNORMAL HIGH (ref 70–99)
Glucose-Capillary: 250 mg/dL — ABNORMAL HIGH (ref 70–99)

## 2021-11-27 LAB — COMPREHENSIVE METABOLIC PANEL
ALT: 17 U/L (ref 0–44)
AST: 12 U/L — ABNORMAL LOW (ref 15–41)
Albumin: 3.7 g/dL (ref 3.5–5.0)
Alkaline Phosphatase: 78 U/L (ref 38–126)
Anion gap: 8 (ref 5–15)
BUN: 11 mg/dL (ref 6–20)
CO2: 22 mmol/L (ref 22–32)
Calcium: 9 mg/dL (ref 8.9–10.3)
Chloride: 109 mmol/L (ref 98–111)
Creatinine, Ser: 0.6 mg/dL — ABNORMAL LOW (ref 0.61–1.24)
GFR, Estimated: >60 mL/min (ref >60)
Glucose, Bld: 205 mg/dL — ABNORMAL HIGH (ref 70–99)
Potassium: 3.5 mmol/L (ref 3.5–5.1)
Sodium: 139 mmol/L (ref 135–145)
Total Bilirubin: 1.4 mg/dL — ABNORMAL HIGH (ref 0.3–1.2)
Total Protein: 6.5 g/dL (ref 6.5–8.1)

## 2021-11-27 LAB — PHOSPHORUS: Phosphorus: 2.3 mg/dL — ABNORMAL LOW (ref 2.5–4.6)

## 2021-11-27 LAB — BETA-HYDROXYBUTYRIC ACID: Beta-Hydroxybutyric Acid: 1.01 mmol/L — ABNORMAL HIGH (ref 0.05–0.27)

## 2021-11-27 LAB — MAGNESIUM: Magnesium: 1.9 mg/dL (ref 1.7–2.4)

## 2021-11-27 LAB — HIV ANTIBODY (ROUTINE TESTING W REFLEX): HIV Screen 4th Generation wRfx: NONREACTIVE

## 2021-11-27 LAB — HEMOGLOBIN A1C
Hgb A1c MFr Bld: 11.2 % — ABNORMAL HIGH (ref 4.8–5.6)
Mean Plasma Glucose: 274.74 mg/dL

## 2021-11-27 MED ORDER — INSULIN GLARGINE-YFGN 100 UNIT/ML ~~LOC~~ SOLN
35.0000 [IU] | Freq: Every day | SUBCUTANEOUS | Status: DC
  Administered 2021-11-27: 35 [IU] via SUBCUTANEOUS
  Filled 2021-11-27 (×2): qty 0.35

## 2021-11-27 MED ORDER — INSULIN ASPART 100 UNIT/ML IJ SOLN
0.0000 [IU] | Freq: Every day | INTRAMUSCULAR | Status: DC
  Administered 2021-11-27: 2 [IU] via SUBCUTANEOUS

## 2021-11-27 MED ORDER — POTASSIUM CHLORIDE CRYS ER 20 MEQ PO TBCR
40.0000 meq | EXTENDED_RELEASE_TABLET | Freq: Once | ORAL | Status: AC
  Administered 2021-11-27: 40 meq via ORAL
  Filled 2021-11-27: qty 2

## 2021-11-27 MED ORDER — INSULIN ASPART 100 UNIT/ML IJ SOLN
0.0000 [IU] | Freq: Three times a day (TID) | INTRAMUSCULAR | Status: DC
  Administered 2021-11-27 – 2021-11-28 (×2): 2 [IU] via SUBCUTANEOUS
  Administered 2021-11-28: 3 [IU] via SUBCUTANEOUS

## 2021-11-27 MED ORDER — INSULIN ASPART 100 UNIT/ML IJ SOLN
6.0000 [IU] | Freq: Three times a day (TID) | INTRAMUSCULAR | Status: DC
Start: 2021-11-27 — End: 2021-11-28
  Administered 2021-11-28 (×2): 6 [IU] via SUBCUTANEOUS

## 2021-11-27 MED ORDER — POTASSIUM CHLORIDE 20 MEQ PO PACK
40.0000 meq | PACK | Freq: Once | ORAL | Status: AC
  Administered 2021-11-27: 40 meq via ORAL
  Filled 2021-11-27: qty 2

## 2021-11-27 MED ORDER — SODIUM CHLORIDE 0.9 % IV SOLN: INTRAVENOUS | Status: DC

## 2021-11-27 NOTE — TOC Progression Note (Signed)
Transition of Care [facility]) - Progression Note    Patient Details  Name: Danny Werner MRN: [id number] Date of Birth: Feb 20, 1996  Transition of Care [facility]) CM/SW Contact  Purcell Mouton, RN Phone Number: 11/27/2021, 9:52 AM  Clinical Narrative:      Transition of Care [facility]) Screening Note   Patient Details  Name: Danny Werner Date of Birth: [birth year]   Transition of Care [facility]) CM/SW Contact:    Purcell Mouton, RN Phone Number: 11/27/2021, 9:52 AM    Transition of Care Department [facility]) has reviewed patient and no TOC needs have been identified at this time. We will continue to monitor patient advancement through interdisciplinary progression rounds. If new patient transition needs arise, please place a TOC consult.         Expected Discharge Plan and Services                                                 Social Determinants of Health (SDOH) Interventions    Readmission Risk Interventions     No data to display

## 2021-11-27 NOTE — Progress Notes (Signed)
PROGRESS NOTE  Danny Werner  DOB: [birth year]  PCP: [facility] YTK:[id number]  DOA: 11/26/2021  LOS: 0 days  Hospital Day: 2  Brief narrative: Danny Werner is a 26 y.o. male with PMH significant for DM2, anxiety/depression, bipolar disorder, ADHD. Patient presented to the ED on 8/13 with complaint of lightheadedness, nausea, vomiting. Patient states he was diagnosed with type 1 diabetes at the age of 42.  He was tried on basal and bolus regimen.  He follows up with an endocrinologist.  He states that he recently had several studies done for his pancreas.  He states his pancreas is still making insulin and hence his diagnosis was recently switched to type II.  He last saw his endocrinologist in April and has been on insulin pump since then.  In June, he had first episode of DKA despite compliance to the pump.  His next appointment with the endocrinologist is in next 2 months.  Lately, patient has been noticing his blood sugar level elevated over 300.  For last few days, patient also started having generalized weakness, malaise, increased thirst. On 8/13, he felt extreme nauseous and vomited several times.  Later in the day, when he attempted to mow his lawn, he felt extremely lightheaded and nearly passed out and hence presented to the ED. In the ED, blood sugar level was elevated to 308, VBG showed pH 7.32, bicarb low at 15, WBC count normal Urine is negative for infection, with blood glucose level over 1000, ketones over 80. Started on management for DKA Admitted to hospitalist service.  Subjective: Patient was seen and examined this morning.  Pleasant young Caucasian male.  Not in physical distress.  No vomiting since yesterday. Chart reviewed. Blood glucose this morning at 158.  Assessment and plan: Early DKA Presented with nausea, vomiting, lightheadedness, dehydration, pH towards lower side at 7.32, elevated ketone and low serum bicarb Started on DKA protocol with  IV fluid, IV insulin drip, electrolyte management. Last set of blood work from this morning with close anion gap, normalized serum bicarb.  Blood glucose level at 158. He will be seen by diabetes care coordinator this afternoon.  I have asked the patient to have his family bring his insulin pump to the hospital.  We will plan to switch him from insulin drip to insulin pump this afternoon.  Uncontrolled type 1 diabetes mellitus -A1c 11.2 on 11/27/2021 -PTA on insulin pump -Currently on insulin drip.  Plan to switch to insulin pump this afternoon. Recent Labs  Lab 11/27/21 0321 11/27/21 0434 11/27/21 0610 11/27/21 0707 11/27/21 0811  GLUCAP 214* 214* 180* 174* 158*   Bipolar I disorder, most recent episode depressed Continue home regimen of psychotropic therapy.   Drug-induced tremor Ongoing resting tremor of the left upper extremity Patient has been evaluated by neurology in the outpatient setting who believes that this may be an Abilify induced tremor  Review of the last neurology note note they mentioned that they wish to continue Abilify for now    GERD without esophagitis Continue PPI, Tums as needed     Goals of care   Code Status: Full Code    Mobility: Encourage ambulation  Skin assessment:     Nutritional status:  Body mass index is 25.77 kg/m.          Diet:  Diet Order             Diet NPO time specified Except for: Ice Chips, Sips with Meds  Diet effective  now                   DVT prophylaxis:  enoxaparin (LOVENOX) injection 40 mg Start: 11/26/21 2200   Antimicrobials: None Fluid: On IV fluid currently Consultants: None Family Communication: None at bedside  Status is: Observation  Continue in-hospital care because: DKA, on IV insulin drip.  Needs further monitoring Level of care: Stepdown   Dispo: The patient is from: Home              Anticipated d/c is to: Home              Patient currently is not medically stable to d/c.    Difficult to place patient No     Infusions:   dextrose 5% lactated ringers 125 mL/hr at 11/27/21 0813   insulin 3.6 Units/hr (11/27/21 0709)   lactated ringers      Scheduled Meds:  ARIPiprazole  20 mg Oral QHS   Chlorhexidine Gluconate Cloth  6 each Topical Daily   enoxaparin (LOVENOX) injection  40 mg Subcutaneous QHS   guanFACINE  4 mg Oral QHS   hydrOXYzine  25 mg Oral BID   nicotine  21 mg Transdermal Daily   pantoprazole (PROTONIX) IV  40 mg Intravenous Q24H    PRN meds: acetaminophen **OR** acetaminophen, dextrose, ondansetron **OR** ondansetron (ZOFRAN) IV, mouth rinse, polyethylene glycol   Antimicrobials: Anti-infectives (From admission, onward)    None       Objective: Vitals:   11/27/21 0600 11/27/21 0700  BP: (!) 111/52 (!) 119/56  Pulse: (!) 57 (!) 59  Resp: 11 12  Temp:    SpO2: 98% 97%    Intake/Output Summary (Last 24 hours) at 11/27/2021 0819 Last data filed at 11/27/2021 0326 Gross per 24 hour  Intake 3225 ml  Output 1300 ml  Net 1925 ml   Filed Weights   11/26/21 1243  Weight: 68.1 kg   Weight change:  Body mass index is 25.77 kg/m.   Physical Exam: General exam: Pleasant, young Caucasian male.  Not in distress Skin: No rashes, lesions or ulcers. HEENT: Atraumatic, normocephalic, no obvious bleeding Lungs: Clear to auscultation bilaterally CVS: Regular rate and rhythm, no murmur GI/Abd soft, nontender, nondistended, bowel sound present CNS: Alert, awake, oriented x3 Psychiatry: Sad affect Extremities: No pedal edema, no calf tenderness  Data Review: I have personally reviewed the laboratory data and studies available.  F/u labs ordered Unresulted Labs (From admission, onward)     Start     Ordered   11/26/21 9169  Basic metabolic panel  (Diabetes Ketoacidosis (DKA))  STAT Now then every 4 hours ,   R (with STAT occurrences)      11/26/21 2122   11/26/21 2121  Beta-hydroxybutyric acid  (Diabetes Ketoacidosis (DKA))  Now  then every 8 hours,   R (with TIMED occurrences)      11/26/21 2122            Signed, Terrilee Croak, MD [facility] 11/27/2021

## 2021-11-27 NOTE — Inpatient Diabetes Management (Addendum)
Inpatient Diabetes Program Recommendations  AACE/ADA: New Consensus Statement on Inpatient Glycemic Control (2015)  Target Ranges:  Prepandial:   less than 140 mg/dL      Peak postprandial:   less than 180 mg/dL (1-2 hours)      Critically ill patients:  140 - 180 mg/dL   Lab Results  Component Value Date   GLUCAP 161 (H) 11/27/2021   HGBA1C 11.2 (H) 11/27/2021    Review of Glycemic Control  Diabetes history: DM2 Outpatient Diabetes medications: OmniPod insulin pump, Basaglar 40 units QD if not on pump Current orders for Inpatient glycemic control: IV insulin per [facility] for DKA  HgbA1C - 11.2%  Inpatient Diabetes Program Recommendations:    Pt prefers to go back on basal/bolus insulin rather than his pump. Will need to f/u with his Endo this week.   Semglee 35 units QD - give 1-2H prior to discontinuation of drip. Novolog 0-9 units x 4H x 12H, then TID with meals and 0-5 HS Novolog 6 units TID with meals  Needs updated c-peptide and GAD.  Spoke with pt and Grandmother about DKA admission and being on insulin pump. Pt states he hasn't seen Endo in last 3 months and prefers to be on SQ insulin at this time. Has Dexcom to check blood sugars and also has meter at home. Has both Basaglar and Novolog at home, along with insulin pen needles. Pt states he has occasional lows and treats with a can of soda. Discussed 15/15 rule. Pt states he saw pump educator but hasn't seen doctor in over 3 months. Long discussion regarding importance of taking care of his diabetes to prevent both long and short term complications. Said weight had been stable over the past month or two, although pt states he used to weigh over 200 pounds. Interested in obtaining Endo in [place]. Pt appears somewhat depressed. Will follow-up again this afternoon.   Thank you. Lorenda Peck, RD, LDN, CDCES Inpatient Diabetes Coordinator [phone]   Spoke with pt, pt's mother again this afternoon regarding his  decision to stop pump and use SQ basal/bolus insulin. Pt appears to be able to make own decisions, hopefully Mom can make appt with Endo for this Friday, August 18th. Pt states he lives with Grandmother and all food available is unhealthy. Pt said he eats only 2 meals/day and sleeps "most all the time." Clearly pt is depressed. Gave suggestions for food choices and recommended daily exercise to assist with blood sugar control. Mother states she is trying to get Healthcare POA for pt.   Follow-up in am.   RV

## 2021-11-28 DIAGNOSIS — Z794 Long term (current) use of insulin: Secondary | ICD-10-CM | POA: Diagnosis not present

## 2021-11-28 DIAGNOSIS — E1110 Type 2 diabetes mellitus with ketoacidosis without coma: Secondary | ICD-10-CM | POA: Diagnosis not present

## 2021-11-28 LAB — CBC WITH DIFFERENTIAL/PLATELET
Abs Immature Granulocytes: 0.05 10*3/uL (ref 0.00–0.07)
Basophils Absolute: 0.1 10*3/uL (ref 0.0–0.1)
Basophils Relative: 1 %
Eosinophils Absolute: 0.1 10*3/uL (ref 0.0–0.5)
Eosinophils Relative: 2 %
HCT: 39.8 % (ref 39.0–52.0)
Hemoglobin: 14.3 g/dL (ref 13.0–17.0)
Immature Granulocytes: 1 %
Lymphocytes Relative: 41 %
Lymphs Abs: 2.2 10*3/uL (ref 0.7–4.0)
MCH: 29.5 pg (ref 26.0–34.0)
MCHC: 35.9 g/dL (ref 30.0–36.0)
MCV: 82.1 fL (ref 80.0–100.0)
Monocytes Absolute: 0.6 10*3/uL (ref 0.1–1.0)
Monocytes Relative: 11 %
Neutro Abs: 2.4 10*3/uL (ref 1.7–7.7)
Neutrophils Relative %: 44 %
Platelets: 265 10*3/uL (ref 150–400)
RBC: 4.85 MIL/uL (ref 4.22–5.81)
RDW: 13.3 % (ref 11.5–15.5)
WBC: 5.4 10*3/uL (ref 4.0–10.5)
nRBC: 0 % (ref 0.0–0.2)

## 2021-11-28 LAB — BASIC METABOLIC PANEL
Anion gap: 10 (ref 5–15)
BUN: 9 mg/dL (ref 6–20)
CO2: 25 mmol/L (ref 22–32)
Calcium: 8.7 mg/dL — ABNORMAL LOW (ref 8.9–10.3)
Chloride: 108 mmol/L (ref 98–111)
Creatinine, Ser: 0.59 mg/dL — ABNORMAL LOW (ref 0.61–1.24)
GFR, Estimated: >60 mL/min (ref >60)
Glucose, Bld: 153 mg/dL — ABNORMAL HIGH (ref 70–99)
Potassium: 3.1 mmol/L — ABNORMAL LOW (ref 3.5–5.1)
Sodium: 143 mmol/L (ref 135–145)

## 2021-11-28 LAB — GLUCOSE, CAPILLARY
Glucose-Capillary: 230 mg/dL — ABNORMAL HIGH (ref 70–99)
Glucose-Capillary: 174 mg/dL — ABNORMAL HIGH (ref 70–99)

## 2021-11-28 MED ORDER — INSULIN GLARGINE-YFGN 100 UNIT/ML ~~LOC~~ SOLN
40.0000 [IU] | Freq: Every day | SUBCUTANEOUS | Status: DC
  Administered 2021-11-28: 40 [IU] via SUBCUTANEOUS
  Filled 2021-11-28: qty 0.4

## 2021-11-28 MED ORDER — INSULIN GLARGINE-YFGN 100 UNIT/ML ~~LOC~~ SOLN
40.0000 [IU] | Freq: Every day | SUBCUTANEOUS | 0 refills | Status: AC
Start: 2021-11-29 — End: 2021-12-29

## 2021-11-28 MED ORDER — PANTOPRAZOLE SODIUM 40 MG PO TBEC: 40.0000 mg | DELAYED_RELEASE_TABLET | Freq: Every day | ORAL | Status: DC

## 2021-11-28 MED ORDER — POTASSIUM CHLORIDE CRYS ER 20 MEQ PO TBCR
40.0000 meq | EXTENDED_RELEASE_TABLET | Freq: Once | ORAL | Status: AC
  Administered 2021-11-28: 40 meq via ORAL
  Filled 2021-11-28: qty 2

## 2021-11-28 MED ORDER — K PHOS MONO-SOD PHOS DI & MONO 155-852-130 MG PO TABS
500.0000 mg | ORAL_TABLET | Freq: Once | ORAL | Status: AC
  Administered 2021-11-28: 500 mg via ORAL
  Filled 2021-11-28: qty 2

## 2021-11-28 MED ORDER — INSULIN ASPART 100 UNIT/ML IJ SOLN
0.0000 [IU] | Freq: Three times a day (TID) | INTRAMUSCULAR | 11 refills | Status: AC
Start: 2021-11-28 — End: ?

## 2021-11-28 MED ORDER — INSULIN ASPART 100 UNIT/ML IJ SOLN: 6.0000 [IU] | Freq: Three times a day (TID) | INTRAMUSCULAR | 0 refills | Status: DC

## 2021-11-28 MED ORDER — INSULIN ASPART 100 UNIT/ML IJ SOLN: 0.0000 [IU] | Freq: Every day | INTRAMUSCULAR | 11 refills | Status: DC

## 2021-11-28 NOTE — Discharge Summary (Signed)
Physician Discharge Summary  Danny Werner UMP:[id number] DOB: 1995-11-02 DOA: 11/26/2021  [facility]P: [facility]  Admit date: 11/26/2021 Discharge date: 11/28/2021  Admitted From: Home Discharge disposition: Home  Recommendations at discharge:  You have been switched from insulin pump to long-acting and short-acting insulin regimen. Follow-up with endocrinology as an outpatient Follow dietary restrictions  Brief narrative: Danny Werner is a 26 y.o. male with PMH significant for DM2, anxiety/depression, bipolar disorder, ADHD. Patient presented to the ED on 8/13 with complaint of lightheadedness, nausea, vomiting. Patient states he was diagnosed with type 1 diabetes at the age of 51.  He was tried on basal and bolus regimen.  He follows up with an endocrinologist.  He states that he recently had several studies done for his pancreas.  He states his pancreas is still making insulin and hence his diagnosis was recently switched to type II.  He last saw his endocrinologist in April and has been on insulin pump since then.  In June, he had first episode of DKA despite compliance to the pump.  He has not seen an endocrinologist since DKA.  For the last few days prior to presentation, patient had been noticing his blood sugar level elevated over 300.  For last few days, patient also started having generalized weakness, malaise, increased thirst. On 8/13, he felt extreme nauseous and vomited several times.  Later in the day, when he attempted to mow his lawn, he felt extremely lightheaded and nearly passed out and hence presented to the ED. In the ED, blood sugar level was elevated to 308, VBG showed pH 7.32, bicarb low at 15, WBC count normal Urine is negative for infection, with blood glucose level over 1000, ketones over 80. Started on management for DKA Admitted to hospitalist service. See below for details  Subjective: Patient was seen and examined this morning.   Lying on bed.   Not in distress.  No new symptoms.  Feels better.  Able to tolerate diet.  Feels ready to go home.  Assessment and plan: Early DKA Presented with nausea, vomiting, lightheadedness, dehydration, pH towards lower side at 7.32, elevated ketone and low serum bicarb Started on DKA protocol with IV fluid, IV insulin drip, electrolyte management. He was subsequently switched to subcutaneous insulin.  Uncontrolled type 1 diabetes mellitus -A1c 11.2 on 11/27/2021 -PTA on insulin pump it seems she has had 2 instances of DKA despite compliance to insulin pump.  Diabetes care coordinator consult was obtained.  She discussed about patient's diabetes management with patient, his mom as well as grandmom.  Since patient has had 2 instances of DKA while on the pump, decision was made to switch him out of pump to basal and bolus insulin.  He was given Lantus 35 units yesterday.  Blood sugar trend as below.  Overall improving but still not optimized.  Given Lantus 40 units this morning.  Also started on scheduled Premeal short-acting insulin 6 units as well as sliding scale insulin. Continue the plan at discharge.  Patient has an appointment with endocrinologist in the next 3 days. Recent Labs  Lab 11/27/21 1417 11/27/21 1636 11/27/21 2123 11/28/21 0759 11/28/21 1117  GLUCAP 169* 178* 250* 230* 174*   Bipolar I disorder, most recent episode depressed Continue home regimen of psychotropic therapy.   Drug-induced tremor Ongoing resting tremor of the left upper extremity Patient has been evaluated by neurology in the outpatient setting who believes that this may be an Abilify induced tremor  Review of  the last neurology note note they mentioned that they wish to continue Abilify for now    GERD without esophagitis Continue PPI, Tums as needed    Wounds:  -    Discharge Exam:   Vitals:   11/28/21 0800 11/28/21 0915 11/28/21 1000 11/28/21 1100  BP:  122/61 118/64 128/68  Pulse:  86 (!) 55 64  Resp:   '14 15 17  '$ Temp: (!) 97.1 F (36.2 C)   97.7 F (36.5 C)  TempSrc: Oral   Oral  SpO2:  99% 98% 99%  Weight:      Height:        Body mass index is 25.77 kg/m.   General exam: Pleasant, young Caucasian male.  Not in distress Skin: No rashes, lesions or ulcers. HEENT: Atraumatic, normocephalic, no obvious bleeding Lungs: Clear to auscultation bilaterally CVS: Regular rate and rhythm, no murmur GI/Abd soft, nontender, nondistended, bowel sound present CNS: Alert, awake, oriented x3 Psychiatry: Sad affect Extremities: No pedal edema, no calf tenderness  Follow ups:    [facility] up.   Contact information: [place] [address] [phone]                 Discharge Instructions:   Discharge Instructions     Call MD for:  difficulty breathing, headache or visual disturbances   Complete by: As directed    Call MD for:  extreme fatigue   Complete by: As directed    Call MD for:  hives   Complete by: As directed    Call MD for:  persistant dizziness or light-headedness   Complete by: As directed    Call MD for:  persistant nausea and vomiting   Complete by: As directed    Call MD for:  severe uncontrolled pain   Complete by: As directed    Call MD for:  temperature >100.4   Complete by: As directed    Diet Carb Modified   Complete by: As directed    Discharge instructions   Complete by: As directed    Recommendations at discharge:   You have been switched from insulin pump to long-acting and short-acting insulin regimen.  Follow-up with endocrinology as an outpatient  Follow dietary restrictions  Discharge instructions for diabetes mellitus: Check blood sugar 3 times a day and bedtime at home. If blood sugar running above 200 or less than 70 please call your MD to adjust insulin. If you notice signs and symptoms of hypoglycemia (low blood sugar) like jitteriness, confusion, thirst,  tremor and sweating, please check blood sugar, drink sugary drink/biscuits/sweets to increase sugar level and call MD or return to ER.    General discharge instructions: Follow with Primary [facility] in 7 days  Please request your PCP  to go over your hospital tests, procedures, radiology results at the follow up. Please get your medicines reviewed and adjusted.  Your PCP may decide to repeat certain labs or tests as needed. Do not drive, operate heavy machinery, perform activities at heights, swimming or participation in water activities or provide baby sitting services if your were admitted for syncope or siezures until you have seen by Primary MD or a Neurologist and advised to do so again. [place] Controlled Substance Reporting System database was reviewed. Do not drive, operate heavy machinery, perform activities at heights, swim, participate in water activities or provide baby-sitting services while on medications for pain, sleep and mood  until your outpatient physician has reevaluated you and advised to do so again.  You are strongly recommended to comply with the dose, frequency and duration of prescribed medications. Activity: As tolerated with Full fall precautions use walker/cane & assistance as needed Avoid using any recreational substances like cigarette, tobacco, alcohol, or non-prescribed drug. If you experience worsening of your admission symptoms, develop shortness of breath, life threatening emergency, suicidal or homicidal thoughts you must seek medical attention immediately by calling 911 or calling your MD immediately  if symptoms less severe. You must read complete instructions/literature along with all the possible adverse reactions/side effects for all the medicines you take and that have been prescribed to you. Take any new medicine only after you have completely understood and accepted all the possible adverse reactions/side effects.  Wear Seat belts  while driving. You were cared for by a hospitalist during your hospital stay. If you have any questions about your discharge medications or the care you received while you were in the hospital after you are discharged, you can call the unit and ask to speak with the hospitalist or the covering physician. Once you are discharged, your primary care physician will handle any further medical issues. Please note that NO REFILLS for any discharge medications will be authorized once you are discharged, as it is imperative that you return to your primary care physician (or establish a relationship with a primary care physician if you do not have one).   Increase activity slowly   Complete by: As directed        Discharge Medications:   Allergies as of 11/28/2021       Reactions   Bee Venom Anaphylaxis, Swelling   Codeine Other (See Comments)   Seizures   Penicillins Other (See Comments)   Seizures Has patient had a PCN reaction causing immediate rash, facial/tongue/throat swelling, SOB or lightheadedness with hypotension: No Has patient had a PCN reaction causing severe rash involving mucus membranes or skin necrosis: No Has patient had a PCN reaction that required hospitalization: Yes Has patient had a PCN reaction occurring within the last 10 years: No If all of the above answers are "NO", then may proceed with Cephalosporin use.   Promethazine Hcl Other (See Comments)   Seizures        Medication List     STOP taking these medications    Basaglar KwikPen 100 UNIT/ML   Omnipod DASH Pods (Gen 4) Misc   Trulicity 1.5 BJ/4.7WG Sopn Generic drug: Dulaglutide       TAKE these medications    acetaminophen 500 MG tablet Commonly known as: TYLENOL Take 500-1,000 mg by mouth every 6 (six) hours as needed for mild pain or headache.   ARIPiprazole 20 MG tablet Commonly known as: ABILIFY Take 20 mg by mouth at bedtime. What changed: Another medication with the same name was removed.  Continue taking this medication, and follow the directions you see here.   Dexcom G7 Sensor Misc Inject 1 Device into the skin See admin instructions. Place 1 device into the skin every 10 days   EPINEPHrine 0.3 mg/0.3 mL Soaj injection Commonly known as: EPI-PEN Inject 0.3 mg into the skin once as needed for anaphylaxis. Inject into the middle of the outer thigh and hold for 3 seconds as needed for severe allergic reaction then call 911 if used.   guanFACINE 4 MG Tb24 ER tablet Commonly known as: INTUNIV Take 4 mg by mouth at bedtime.   hydrOXYzine 25 MG capsule  Commonly known as: VISTARIL Take 25 mg by mouth in the morning and at bedtime.   insulin aspart 100 UNIT/ML injection Commonly known as: novoLOG Inject 0-9 Units into the skin 3 (three) times daily with meals.   insulin aspart 100 UNIT/ML injection Commonly known as: novoLOG Inject 0-5 Units into the skin at bedtime.   insulin aspart 100 UNIT/ML injection Commonly known as: novoLOG Inject 6 Units into the skin 3 (three) times daily with meals.   insulin glargine-yfgn 100 UNIT/ML injection Commonly known as: SEMGLEE Inject 0.4 mLs (40 Units total) into the skin daily. Start taking on: November 29, 2021   insulin lispro 100 UNIT/ML injection Commonly known as: HUMALOG Inject into the skin See admin instructions. For use with insulin pump and bolus 15 units after each meal OR AS OTHERWISE INSTRUCTED   propranolol 40 MG tablet Commonly known as: INDERAL Take 40 mg by mouth in the morning and at bedtime.         The results of significant diagnostics from this hospitalization (including imaging, microbiology, ancillary and laboratory) are listed below for reference.    Procedures and Diagnostic Studies:   DG Chest 1 View  Result Date: 11/26/2021 CLINICAL DATA:  Weakness and syncopal episodes. EXAM: CHEST  1 VIEW COMPARISON:  Chest radiograph November 12, 2008 FINDINGS: The heart size and mediastinal contours are  within normal limits. No focal airspace consolidation. No visible pleural effusion or pneumothorax. The visualized skeletal structures are unremarkable. IMPRESSION: No acute cardiopulmonary disease. Electronically Signed   By: Dahlia Bailiff M.D.   On: 11/26/2021 15:38     Labs:   Basic Metabolic Panel: Recent Labs  Lab 11/26/21 2134 11/27/21 0103 11/27/21 0538 11/27/21 0956 11/28/21 0313  NA 141 139 139 140 143  K 3.4* 3.2* 3.5 3.4* 3.1*  CL 110 110 109 109 108  CO2 '22 22 22 24 25  '$ GLUCOSE 180* 182* 205* 159* 153*  BUN '9 10 11 10 9  '$ CREATININE 0.65 0.61 0.60* 0.76 0.59*  CALCIUM 9.5 9.0 9.0 9.4 8.7*  MG  --   --  1.9  --   --   PHOS  --   --   --  2.3*  --    GFR Estimated Creatinine Clearance: 119.2 mL/min (A) (by C-G formula based on SCr of 0.59 mg/dL (L)). Liver Function Tests: Recent Labs  Lab 11/27/21 0538  AST 12*  ALT 17  ALKPHOS 78  BILITOT 1.4*  PROT 6.5  ALBUMIN 3.7   No results for input(s): "LIPASE", "AMYLASE" in the last 168 hours. No results for input(s): "AMMONIA" in the last 168 hours. Coagulation profile No results for input(s): "INR", "PROTIME" in the last 168 hours.  CBC: Recent Labs  Lab 11/26/21 1253 11/26/21 1543 11/27/21 0538 11/28/21 0313  WBC 9.0  --  5.9 5.4  NEUTROABS  --   --  2.7 2.4  HGB 18.0* 17.3* 14.9 14.3  HCT 49.0 51.0 40.7 39.8  MCV 79.8*  --  79.8* 82.1  PLT 329  --  293 265   Cardiac Enzymes: No results for input(s): "CKTOTAL", "CKMB", "CKMBINDEX", "TROPONINI" in the last 168 hours. BNP: Invalid input(s): "POCBNP" CBG: Recent Labs  Lab 11/27/21 1417 11/27/21 1636 11/27/21 2123 11/28/21 0759 11/28/21 1117  GLUCAP 169* 178* 250* 230* 174*   D-Dimer No results for input(s): "DDIMER" in the last 72 hours. Hgb A1c Recent Labs    11/27/21 0103  HGBA1C 11.2*   Lipid Profile No results for input(s): "  CHOL", "HDL", "LDLCALC", "TRIG", "CHOLHDL", "LDLDIRECT" in the last 72 hours. Thyroid function studies No  results for input(s): "TSH", "T4TOTAL", "T3FREE", "THYROIDAB" in the last 72 hours.  Invalid input(s): "FREET3" Anemia work up No results for input(s): "VITAMINB12", "FOLATE", "FERRITIN", "TIBC", "IRON", "RETICCTPCT" in the last 72 hours. Microbiology Recent Results (from the past 240 hour(s))  MRSA Next Gen by PCR, Nasal     Status: None   Collection Time: 11/26/21  8:20 PM   Specimen: Nasal Mucosa; Nasal Swab  Result Value Ref Range Status   MRSA by PCR Next Gen NOT DETECTED NOT DETECTED Final    Comment: (NOTE) The GeneXpert MRSA Assay (FDA approved for NASAL specimens only), is one component of a comprehensive MRSA colonization surveillance program. It is not intended to diagnose MRSA infection nor to guide or monitor treatment for MRSA infections. Test performance is not FDA approved in patients less than 53 years old. Performed at [facility], [place] [address], [place], [place] [address]     Time coordinating discharge: 35 minutes  Signed: Marlowe Aschoff Gerald Kuehl  [facility] 11/28/2021, 1:31 PM

## 2021-11-28 NOTE — Progress Notes (Signed)
PHARMACIST - PHYSICIAN COMMUNICATION  DR:   Pietro Cassis  CONCERNING: IV to Oral Route Change Policy  RECOMMENDATION: This patient is receiving protonix by the intravenous route.  Based on criteria approved by the Pharmacy and Therapeutics Committee, the intravenous medication(s) is/are being converted to the equivalent oral dose form(s).   DESCRIPTION: These criteria include: The patient is eating (either orally or via tube) and/or has been taking other orally administered medications for a least 24 hours The patient has no evidence of active gastrointestinal bleeding or impaired GI absorption (gastrectomy, short bowel, patient on TNA or NPO).  If you have questions about this conversion, please contact the Pharmacy Department  '[]'$   [phone] )  [facility] '[]'$   [phone] )  [facility] '[]'$   [phone] )  [facility] '[]'$   [phone] )  [facility] '[x]'$   [phone] )  [place], PharmD, BCPS 11/28/2021 11:13 AM

## 2021-11-28 NOTE — Progress Notes (Signed)
Reviewed discharge teaching w/ pt and mother, Danny Werner, over the phone. Pt verbalized understanding and agreed with plan of care. Mother became upset and verbally aggressive. Stating that "his endocrinologist does not want him taken off his insulin pump, and he's not going to take the insulin". This RN and Agricultural consultant, Engineer, manufacturing, discussed with mom that pt is oriented and appropriate to make decisions on his own, and this is the plan of care that hospitalist has decided based off his hospital stay, and it will be best to discuss long term plans with endocrinologist on follow-up appt. Educated pt on importance of monitoring blood sugars and administering insulin appropriately. Pt verbalizes understanding, and states that he will take medication appropriately.

## 2021-11-28 NOTE — Inpatient Diabetes Management (Signed)
Inpatient Diabetes Program Recommendations  AACE/ADA: New Consensus Statement on Inpatient Glycemic Control (2015)  Target Ranges:  Prepandial:   less than 140 mg/dL      Peak postprandial:   less than 180 mg/dL (1-2 hours)      Critically ill patients:  140 - 180 mg/dL   Lab Results  Component Value Date   GLUCAP 174 (H) 11/28/2021   HGBA1C 11.2 (H) 11/27/2021    Review of Glycemic Control  Diabetes history: DM1 Outpatient Diabetes medications: OmniPod insulin pump, Basaglar 40 QD if pump is off, Novolog 8 units TID if pump is off Current orders for Inpatient glycemic control: Semglee 40 QD, Novolog 0-9 units TID with meals and 0-5 HS + 6 units TID  HgbA1C - 11.2%  Inpatient Diabetes Program Recommendations:    For discharge:  Basaglar 40 QD Novolog 10 units TID with meals  Spoke with pt at bedside this am. Pt will f/u with Endo on Friday, 8/18, with mother. Pt states he will use Dexcom CGM when he gets home. Has blood glucose meter for backup. Stressed importance of trying to make healthy choices and not skipping meals. Pt says "There is no healthy food at home, just a bunch of junk." We talked about being responsible for self and getting healthier food - lean meats and lots of fresh vegetables and fruit. Pt states he buys breakfast every morning from fast food restaurant. Discussed importance of lowering HgbA1C to prevent both long and short-term complications. Talked about blood sugar and HgbA1C goals.   Will continue to follow while inpatient.  Thank you. Lorenda Peck, RD, LDN, [facility] Diabetes Coordinator [phone]

## 2022-04-01 ENCOUNTER — Emergency Department (HOSPITAL_COMMUNITY)
Admission: EM | Admit: 2022-04-01 | Discharge: 2022-04-01 | Disposition: A | Discharge status: Home / Self Care | Payer: Commercial Managed Care - HMO | Attending: Emergency Medicine | Admitting: Emergency Medicine

## 2022-04-01 ENCOUNTER — Emergency Department (HOSPITAL_COMMUNITY): Payer: Commercial Managed Care - HMO

## 2022-04-01 ENCOUNTER — Other Ambulatory Visit: Payer: Self-pay

## 2022-04-01 DIAGNOSIS — W2201XA Walked into wall, initial encounter: Secondary | ICD-10-CM | POA: Diagnosis not present

## 2022-04-01 DIAGNOSIS — Z794 Long term (current) use of insulin: Secondary | ICD-10-CM | POA: Diagnosis not present

## 2022-04-01 DIAGNOSIS — S60511A Abrasion of right hand, initial encounter: Secondary | ICD-10-CM | POA: Diagnosis not present

## 2022-04-01 DIAGNOSIS — M79641 Pain in right hand: Secondary | ICD-10-CM | POA: Diagnosis present

## 2022-04-01 LAB — CBG MONITORING, ED: Glucose-Capillary: 388 mg/dL — ABNORMAL HIGH (ref 70–99)

## 2022-04-01 NOTE — Discharge Instructions (Signed)
Take Tylenol and Motrin for pain.  Your x-ray was negative for any fracture.  If the pain does not improve in the next few weeks follow-up with pcp for check in the next weeks.

## 2022-04-01 NOTE — ED Triage Notes (Signed)
Pt reports punching a wall with his right hand about 1 hour ago "I hit the concrete 7-8 times and a brick wall twice". Abrasions and swelling to the right hand.

## 2022-04-01 NOTE — ED Provider Notes (Signed)
[facility]-EMERGENCY DEPT Provider Note   CSN: [id number] Arrival date & time: 04/01/22  2205     History  Chief Complaint  Patient presents with   Hand Pain    Danny Werner is a 26 y.o. male.   Hand Pain     Patient presents due to right hand pain.  Happened after punching a concrete/brick wall.  Denies any contact with human.  Pain is mostly over the knuckle of his pinky finger.  He raises his left hand, states he uses right hand for symptoms.  No numbness, no rest pain.  Unsure last tetanus.  Home Medications Prior to Admission medications   Medication Sig Start Date End Date Taking? Authorizing Provider  acetaminophen (TYLENOL) 500 MG tablet Take 500-1,000 mg by mouth every 6 (six) hours as needed for mild pain or headache.    [provider]  ARIPiprazole (ABILIFY) 20 MG tablet Take 20 mg by mouth at bedtime.    [provider]  Continuous Blood Gluc Sensor (DEXCOM G7 SENSOR) MISC Inject 1 Device into the skin See admin instructions. Place 1 device into the skin every 10 days    [provider]  EPINEPHrine 0.3 mg/0.3 mL IJ SOAJ injection Inject 0.3 mg into the skin once as needed for anaphylaxis. Inject into the middle of the outer thigh and hold for 3 seconds as needed for severe allergic reaction then call 911 if used. 10/18/20   [provider]  guanFACINE (INTUNIV) 4 MG TB24 ER tablet Take 4 mg by mouth at bedtime. 03/28/21   [provider]  hydrOXYzine (VISTARIL) 25 MG capsule Take 25 mg by mouth in the morning and at bedtime. 09/07/21   [provider]  insulin aspart (NOVOLOG) 100 UNIT/ML injection Inject 0-9 Units into the skin 3 (three) times daily with meals. 11/28/21   [name], [name], MD  insulin aspart (NOVOLOG) 100 UNIT/ML injection Inject 0-5 Units into the skin at bedtime. 11/28/21   [name], [name], MD  insulin aspart (NOVOLOG) 100 UNIT/ML injection Inject 6 Units into the skin 3 (three)  times daily with meals. 11/28/21 12/28/21  [name], [name], MD  insulin lispro (HUMALOG) 100 UNIT/ML injection Inject into the skin See admin instructions. For use with insulin pump and bolus 15 units after each meal OR AS OTHERWISE INSTRUCTED 04/27/21   [provider]  propranolol (INDERAL) 40 MG tablet Take 40 mg by mouth in the morning and at bedtime.    [provider]      Allergies    Bee venom, Codeine, Penicillins, and Promethazine hcl    Review of Systems   Review of Systems  Physical Exam Updated Vital Signs BP 124/81 (BP Location: Left Arm)   Pulse 79   Temp 98.3 F (36.8 C) (Oral)   Resp 16   SpO2 100%  Physical Exam Vitals and nursing note reviewed. Exam conducted with a chaperone present.  Constitutional:      General: He is not in acute distress.    Appearance: Normal appearance.  HENT:     Head: Normocephalic and atraumatic.  Eyes:     General: No scleral icterus.    Extraocular Movements: Extraocular movements intact.     Pupils: Pupils are equal, round, and reactive to light.  Cardiovascular:     Pulses: Normal pulses.  Musculoskeletal:        General: Tenderness present.     Comments: Tenderness over knuckle of fifth digit right hand.  Decreased lection  extension at the DIP.  Complete ROM to wrist.  Able to move the digit without difficulty.  Skin:    Capillary Refill: Capillary refill takes less than 2 seconds.     Coloration: Skin is not jaundiced.     Comments: Superficial abrasions over knuckles of right hand   Neurological:     Mental Status: He is alert. Mental status is at baseline.     Coordination: Coordination normal.     ED Results / Procedures / Treatments   Labs (all labs ordered are listed, but only abnormal results are displayed) Labs Reviewed  CBG MONITORING, ED    EKG None  Radiology DG Hand Complete Right  Result Date: 04/01/2022 CLINICAL DATA:  Patient reports punching a wall with his right hand. EXAM:  RIGHT HAND - COMPLETE 3+ VIEW COMPARISON:  None Available. FINDINGS: There is no evidence of fracture or dislocation. There is no evidence of arthropathy or other focal bone abnormality. Soft tissues are unremarkable. IMPRESSION: Negative. Electronically Signed   By: Larose Hires D.O.   On: 04/01/2022 22:54    Procedures Procedures    Medications Ordered in ED Medications - No data to display  ED Course/ Medical Decision Making/ A&P                           Medical Decision Making Amount and/or Complexity of Data Reviewed Radiology: ordered.   Patient presents due to right hand pain.  Differential includes not limited to fractures, dislocations, myalgias, tendon injury.    On exam patient is neurovascular intact with brisk cap refill, radial pulses 2+.  No crepitus, he does not have reproducible tenderness over the knuckles.  Will order an x-ray and reevaluate.  Plain film is negative for fracture, agree with radiologist. I updated the patient, advised tylenol and motrin.  Patient's mother is now at bedside requesting we check his sugar.  Patient denies any symptoms, he has a Dexcom but broke his phone so hasn't been checking. Denies blurry vision, polydipsia, polyuria.  Will do a point-of-care glucose for family but encouraged him continue taking his home medicine to follow-up with his PCP regarding hypoglycemia management.        Final Clinical Impression(s) / ED Diagnoses Final diagnoses:  Right hand pain    Rx / DC Orders ED Discharge Orders     None         Theron Arista, PA-C 04/04/22 1426    Charlynne Pander, MD 04/04/22 [phone]

## 2022-04-01 NOTE — ED Notes (Signed)
Pt verbalized understanding of discharge instructions. Pt has access to home. Pt dressed for discharge.

## 2022-10-04 ENCOUNTER — Emergency Department (HOSPITAL_COMMUNITY)
Admission: EM | Admit: 2022-10-04 | Discharge: 2022-10-05 | Disposition: A | Discharge status: Home / Self Care | Payer: Commercial Managed Care - HMO | Attending: Emergency Medicine | Admitting: Emergency Medicine

## 2022-10-04 ENCOUNTER — Encounter (HOSPITAL_COMMUNITY): Payer: Self-pay

## 2022-10-04 ENCOUNTER — Other Ambulatory Visit: Payer: Self-pay

## 2022-10-04 DIAGNOSIS — R739 Hyperglycemia, unspecified: Secondary | ICD-10-CM | POA: Diagnosis present

## 2022-10-04 DIAGNOSIS — Z794 Long term (current) use of insulin: Secondary | ICD-10-CM | POA: Diagnosis not present

## 2022-10-04 DIAGNOSIS — E1165 Type 2 diabetes mellitus with hyperglycemia: Secondary | ICD-10-CM | POA: Insufficient documentation

## 2022-10-04 HISTORY — DX: Tremor, unspecified: R25.1

## 2022-10-04 LAB — CBC
HCT: 46.9 % (ref 39.0–52.0)
Hemoglobin: 16.7 g/dL (ref 13.0–17.0)
MCH: 28.2 pg (ref 26.0–34.0)
MCHC: 35.6 g/dL (ref 30.0–36.0)
MCV: 79.2 fL — ABNORMAL LOW (ref 80.0–100.0)
Platelets: 332 10*3/uL (ref 150–400)
RBC: 5.92 MIL/uL — ABNORMAL HIGH (ref 4.22–5.81)
RDW: 12.4 % (ref 11.5–15.5)
WBC: 6.4 10*3/uL (ref 4.0–10.5)
nRBC: 0 % (ref 0.0–0.2)

## 2022-10-04 LAB — BASIC METABOLIC PANEL
Anion gap: 12 (ref 5–15)
BUN: 15 mg/dL (ref 6–20)
CO2: 23 mmol/L (ref 22–32)
Calcium: 9.5 mg/dL (ref 8.9–10.3)
Chloride: 100 mmol/L (ref 98–111)
Creatinine, Ser: 0.69 mg/dL (ref 0.61–1.24)
GFR, Estimated: >60 mL/min (ref >60)
Glucose, Bld: 251 mg/dL — ABNORMAL HIGH (ref 70–99)
Potassium: 3.5 mmol/L (ref 3.5–5.1)
Sodium: 135 mmol/L (ref 135–145)

## 2022-10-04 LAB — BLOOD GAS, VENOUS
Acid-Base Excess: 3.1 mmol/L — ABNORMAL HIGH (ref 0.0–2.0)
Bicarbonate: 29.1 mmol/L — ABNORMAL HIGH (ref 20.0–28.0)
O2 Saturation: 51 %
Patient temperature: 37
pCO2, Ven: 48 mmHg (ref 44–60)
pH, Ven: 7.39 (ref 7.25–7.43)
pO2, Ven: <31 mmHg — CL (ref 32–45)

## 2022-10-04 LAB — CBG MONITORING, ED: Glucose-Capillary: 265 mg/dL — ABNORMAL HIGH (ref 70–99)

## 2022-10-04 NOTE — ED Triage Notes (Signed)
Pt BIB [facility] from home. Presents with hyperglycemia. Pt reports it has been in the "300s" for 5 hours and has taken 80 units of Humalog insulin with no change. Pt has associated CP, nausea, diaphoresis, and fatigue. EMS admin 500 mL of NaCl.  EMS Vitals  CBG 363 HR 58 124/80 SpO2 99% on R/A

## 2022-10-04 NOTE — ED Notes (Addendum)
Date and time results received: 10/04/22 11:13 PM  (use smartphrase ".now" to insert current time)  Test: PO2 Critical Value: <31  Name of Provider Notified: Adela Lank  Orders Received? Or Actions Taken?: Orders Received - See Orders for details

## 2022-10-05 ENCOUNTER — Emergency Department (HOSPITAL_COMMUNITY): Payer: Commercial Managed Care - HMO

## 2022-10-05 LAB — URINALYSIS, ROUTINE W REFLEX MICROSCOPIC
Bacteria, UA: NONE SEEN
Bilirubin Urine: NEGATIVE
Glucose, UA: >=500 mg/dL — AB
Hgb urine dipstick: NEGATIVE
Ketones, ur: 20 mg/dL — AB
Leukocytes,Ua: NEGATIVE
Nitrite: NEGATIVE
Protein, ur: NEGATIVE mg/dL
Specific Gravity, Urine: 1.016 (ref 1.005–1.030)
pH: 6 (ref 5.0–8.0)

## 2022-10-05 LAB — CBG MONITORING, ED
Glucose-Capillary: 155 mg/dL — ABNORMAL HIGH (ref 70–99)
Glucose-Capillary: 128 mg/dL — ABNORMAL HIGH (ref 70–99)

## 2022-10-05 LAB — TROPONIN I (HIGH SENSITIVITY)
Troponin I (High Sensitivity): 2 ng/L (ref <18)
Troponin I (High Sensitivity): 3 ng/L (ref <18)

## 2022-10-05 LAB — HEPATIC FUNCTION PANEL
ALT: 22 U/L (ref 0–44)
AST: 17 U/L (ref 15–41)
Albumin: 4.4 g/dL (ref 3.5–5.0)
Alkaline Phosphatase: 121 U/L (ref 38–126)
Bilirubin, Direct: 0.2 mg/dL (ref 0.0–0.2)
Indirect Bilirubin: 0.8 mg/dL (ref 0.3–0.9)
Total Bilirubin: 1 mg/dL (ref 0.3–1.2)
Total Protein: 8 g/dL (ref 6.5–8.1)

## 2022-10-05 LAB — LIPASE, BLOOD: Lipase: 31 U/L (ref 11–51)

## 2022-10-05 LAB — BETA-HYDROXYBUTYRIC ACID: Beta-Hydroxybutyric Acid: 0.72 mmol/L — ABNORMAL HIGH (ref 0.05–0.27)

## 2022-10-05 MED ORDER — LACTATED RINGERS IV BOLUS
1000.0000 mL | Freq: Once | INTRAVENOUS | Status: AC
  Administered 2022-10-05: 1000 mL via INTRAVENOUS

## 2022-10-05 MED ORDER — KETOROLAC TROMETHAMINE 15 MG/ML IJ SOLN
15.0000 mg | Freq: Once | INTRAMUSCULAR | Status: AC
  Administered 2022-10-05: 15 mg via INTRAVENOUS
  Filled 2022-10-05: qty 1

## 2022-10-05 NOTE — ED Notes (Signed)
Lab called to add on hepatic function, trop, lipase, and beta-hydroxy

## 2022-10-05 NOTE — Discharge Instructions (Signed)
Please follow-up with your new endocrinologist as discussed. Increase your hydration and continue to monitor your blood glucose at home.  Return to the ER with any new severe symptoms

## 2022-10-05 NOTE — ED Provider Notes (Signed)
[facility] EMERGENCY DEPARTMENT AT [facility] Provider Note   CSN: [id number] Arrival date & time: 10/04/22  2206     History  Chief Complaint  Patient presents with   Hyperglycemia    Danny Werner is a 27 y.o. male with hx of DMT2, who presents with concern for elevated blood sugars this evening in the 300s with concern for associated nausea, right-sided chest pain, fatigue, and body aches.  Received fluids and route which he states gave him "a burst of energy".  Denies fevers or chills, no palpitations or shortness of breath, no difficulty breathing.  Patient states that his symptoms started after he walked outside in the heat.  I personally read medical records.  Initially above listed history is history of GERD with esophagitis, DKA with admission in 2023.  He is not anticoagulated.  Endorses compliance with his insulin, has insulin pump on lower abdomen visible at time of my exam without evidence of infection.  HPI     Home Medications Prior to Admission medications   Medication Sig Start Date End Date Taking? Authorizing Provider  acetaminophen (TYLENOL) 500 MG tablet Take 500-1,000 mg by mouth every 6 (six) hours as needed for mild pain or headache.   Yes [provider]  ARIPiprazole (ABILIFY) 20 MG tablet Take 20 mg by mouth at bedtime.   Yes [provider]  EPINEPHrine 0.3 mg/0.3 mL IJ SOAJ injection Inject 0.3 mg into the skin once as needed for anaphylaxis. Inject into the middle of the outer thigh and hold for 3 seconds as needed for severe allergic reaction then call 911 if used. 10/18/20  Yes [provider]  guanFACINE (INTUNIV) 4 MG TB24 ER tablet Take 4 mg by mouth at bedtime. 03/28/21  Yes [provider]  hydrOXYzine (VISTARIL) 25 MG capsule Take 25 mg by mouth in the morning and at bedtime. 09/07/21  Yes [provider]  insulin lispro (HUMALOG) 100 UNIT/ML injection Inject into the skin See admin  instructions. For use with insulin pump and bolus 15 units after each meal OR AS OTHERWISE INSTRUCTED 04/27/21  Yes [provider]  Melatonin 10 MG CAPS Take 1 capsule by mouth at bedtime. 03/19/22  Yes [provider]  omeprazole (PRILOSEC) 40 MG capsule Take 40 mg by mouth daily.   Yes [provider]  propranolol ER (INDERAL LA) 120 MG 24 hr capsule Take 120 mg by mouth in the morning and at bedtime.   Yes [provider]  traZODone (DESYREL) 50 MG tablet Take 50 mg by mouth at bedtime. 09/21/22  Yes [provider]  Continuous Blood Gluc Sensor (DEXCOM G7 SENSOR) MISC Inject 1 Device into the skin See admin instructions. Place 1 device into the skin every 10 days    [provider]  insulin aspart (NOVOLOG) 100 UNIT/ML injection Inject 0-9 Units into the skin 3 (three) times daily with meals. Patient not taking: Reported on 10/05/2022 11/28/21   [name], MD  insulin aspart (NOVOLOG) 100 UNIT/ML injection Inject 0-5 Units into the skin at bedtime. Patient not taking: Reported on 10/05/2022 11/28/21   [name], MD  insulin aspart (NOVOLOG) 100 UNIT/ML injection Inject 6 Units into the skin 3 (three) times daily with meals. 11/28/21 12/28/21  [name], MD      Allergies    Bee venom, Codeine, Penicillins, and Promethazine hcl    Review of Systems   Review of Systems  Constitutional:  Positive for appetite change and  fatigue. Negative for chills and fever.  HENT: Negative.    Respiratory: Negative.    Cardiovascular:  Positive for chest pain. Negative for palpitations and leg swelling.  Gastrointestinal:  Positive for abdominal pain and nausea. Negative for diarrhea.  Musculoskeletal:  Positive for myalgias.    Physical Exam Updated Vital Signs BP (!) 134/91   Pulse (!) 43   Temp 97.8 F (36.6 C) (Oral)   Resp 16   Ht 5\' 4"  (1.626 m)   Wt 70.8 kg   SpO2 98%   BMI 26.78 kg/m  Physical Exam Vitals and nursing note  reviewed.  Constitutional:      Appearance: He is not ill-appearing or toxic-appearing.  HENT:     Head: Normocephalic and atraumatic.     Nose: Nose normal.     Mouth/Throat:     Mouth: Mucous membranes are moist.     Pharynx: No oropharyngeal exudate or posterior oropharyngeal erythema.  Eyes:     General:        Right eye: No discharge.        Left eye: No discharge.     Extraocular Movements: Extraocular movements intact.     Conjunctiva/sclera: Conjunctivae normal.     Pupils: Pupils are equal, round, and reactive to light.  Cardiovascular:     Rate and Rhythm: Normal rate and regular rhythm.     Pulses: Normal pulses.     Heart sounds: Normal heart sounds. No murmur heard. Pulmonary:     Effort: Pulmonary effort is normal. No respiratory distress.     Breath sounds: Normal breath sounds. No wheezing or rales.  Abdominal:     General: Bowel sounds are normal. There is no distension.     Palpations: Abdomen is soft.     Tenderness: There is abdominal tenderness in the left lower quadrant. There is no right CVA tenderness, left CVA tenderness, guarding or rebound.     Hernia: No hernia is present.     Comments: No rebound or guarding in the abdomen  Musculoskeletal:        General: No deformity.     Cervical back: Neck supple.  Skin:    General: Skin is warm and dry.     Capillary Refill: Capillary refill takes less than 2 seconds.  Neurological:     General: No focal deficit present.     Mental Status: He is alert and oriented to person, place, and time. Mental status is at baseline.  Psychiatric:        Mood and Affect: Mood normal.     ED Results / Procedures / Treatments   Labs (all labs ordered are listed, but only abnormal results are displayed) Labs Reviewed  BASIC METABOLIC PANEL - Abnormal; Notable for the following components:      Result Value   Glucose, Bld 251 (*)    All other components within normal limits  CBC - Abnormal; Notable for the following  components:   RBC 5.92 (*)    MCV 79.2 (*)    All other components within normal limits  URINALYSIS, ROUTINE W REFLEX MICROSCOPIC - Abnormal; Notable for the following components:   Color, Urine STRAW (*)    Glucose, UA >=500 (*)    Ketones, ur 20 (*)    All other components within normal limits  BLOOD GAS, VENOUS - Abnormal; Notable for the following components:   pO2, Ven <31 (*)    Bicarbonate 29.1 (*)    Acid-Base Excess 3.1 (*)  All other components within normal limits  BETA-HYDROXYBUTYRIC ACID - Abnormal; Notable for the following components:   Beta-Hydroxybutyric Acid 0.72 (*)    All other components within normal limits  CBG MONITORING, ED - Abnormal; Notable for the following components:   Glucose-Capillary 265 (*)    All other components within normal limits  CBG MONITORING, ED - Abnormal; Notable for the following components:   Glucose-Capillary 155 (*)    All other components within normal limits  CBG MONITORING, ED - Abnormal; Notable for the following components:   Glucose-Capillary 128 (*)    All other components within normal limits  HEPATIC FUNCTION PANEL  LIPASE, BLOOD  TROPONIN I (HIGH SENSITIVITY)  TROPONIN I (HIGH SENSITIVITY)    EKG None  Radiology DG Chest Portable 1 View  Result Date: 10/05/2022 CLINICAL DATA:  Upright chest pain for 24 hours EXAM: PORTABLE CHEST 1 VIEW COMPARISON:  11/26/2021 FINDINGS: Stable cardiomediastinal silhouette. No focal consolidation, pleural effusion, or pneumothorax. No displaced rib fractures. IMPRESSION: No active disease. Electronically Signed   By: Minerva Fester M.D.   On: 10/05/2022 01:08    Procedures Procedures    Medications Ordered in ED Medications  lactated ringers bolus 1,000 mL (0 mLs Intravenous Stopped 10/05/22 0300)  ketorolac (TORADOL) 15 MG/ML injection 15 mg (15 mg Intravenous Given 10/05/22 0118)    ED Course/ Medical Decision Making/ A&P                             Medical Decision  Making Amount and/or Complexity of Data Reviewed Labs: ordered.    Details: CBC without leukocytosis or anemia, BMP with hyperglycemia of 251.  VBG without acidosis, mildly elevated with advised acid 3.  Beta hydroxybutyrate Elevated to 0.72 UA with greater than 500 glucose and ketonuria, hepatic function panel unremarkable, lipase is normal.  Troponin is normal, 3 and 2.  Radiology: ordered.    Details: Chest x-ray visualized with provider is negative for acute cardiopulmonary disease.  ECG/medicine tests:     Details:   EKG with normal sinus rhythm without ischemic changes or interval changes.  Risk Prescription drug management.   Patient reevaluated after IV fluids, patient has insulin pump in place and CBG improved to 128.  He states complete resolution of his symptoms, tolerating p.o. at this time, ambulatory in the emergency room without difficulty.  Patient is hemodynamically stable, heart rate of 62 at time of my evaluation.  Transient episodes of bradycardia to the 50s, history of same on chart review.  Suspect mild dehydration contributing to presentation, also did have hyperglycemia now resolved.  While the exact etiology of symptoms remains unclear there does not appear to be any emergent source that would warrant further ED workup or inpatient management at this time. No evidence of DKA at this time, patient is hemodynamically stable.  Eldor voiced understanding of his medical evaluation and treatment plan. Each of their questions answered to their expressed satisfaction.  Return precautions were given.  Patient is well-appearing, stable, and was discharged in good condition.  This chart was dictated using voice recognition software, Dragon. Despite the best efforts of this provider to proofread and correct errors, errors may still occur which can change documentation meaning.    Final Clinical Impression(s) / ED Diagnoses Final diagnoses:  Hyperglycemia    Rx / DC  Orders ED Discharge Orders     None         Jennessa Trigo R,  PA-C 10/05/22 1610    Maia Plan, MD 10/05/22 [phone]

## 2023-01-13 ENCOUNTER — Emergency Department (HOSPITAL_COMMUNITY)
Admission: EM | Admit: 2023-01-13 | Discharge: 2023-01-15 | Disposition: A | Discharge status: Home / Self Care | Payer: Commercial Managed Care - HMO | Attending: Emergency Medicine | Admitting: Emergency Medicine

## 2023-01-13 DIAGNOSIS — Z794 Long term (current) use of insulin: Secondary | ICD-10-CM | POA: Insufficient documentation

## 2023-01-13 DIAGNOSIS — R45851 Suicidal ideations: Secondary | ICD-10-CM | POA: Insufficient documentation

## 2023-01-13 DIAGNOSIS — E1165 Type 2 diabetes mellitus with hyperglycemia: Secondary | ICD-10-CM | POA: Diagnosis not present

## 2023-01-13 DIAGNOSIS — R739 Hyperglycemia, unspecified: Secondary | ICD-10-CM

## 2023-01-13 DIAGNOSIS — F321 Major depressive disorder, single episode, moderate: Secondary | ICD-10-CM | POA: Diagnosis present

## 2023-01-13 DIAGNOSIS — F17290 Nicotine dependence, other tobacco product, uncomplicated: Secondary | ICD-10-CM | POA: Insufficient documentation

## 2023-01-13 DIAGNOSIS — Z85828 Personal history of other malignant neoplasm of skin: Secondary | ICD-10-CM | POA: Insufficient documentation

## 2023-01-13 DIAGNOSIS — F332 Major depressive disorder, recurrent severe without psychotic features: Secondary | ICD-10-CM | POA: Diagnosis present

## 2023-01-13 DIAGNOSIS — F3130 Bipolar disorder, current episode depressed, mild or moderate severity, unspecified: Secondary | ICD-10-CM | POA: Diagnosis present

## 2023-01-13 LAB — I-STAT CHEM 8, ED
BUN: 17 mg/dL (ref 6–20)
Calcium, Ion: 1.17 mmol/L (ref 1.15–1.40)
Chloride: 98 mmol/L (ref 98–111)
Creatinine, Ser: 0.7 mg/dL (ref 0.61–1.24)
Glucose, Bld: 431 mg/dL — ABNORMAL HIGH (ref 70–99)
HCT: 48 % (ref 39.0–52.0)
Hemoglobin: 16.3 g/dL (ref 13.0–17.0)
Potassium: 3.8 mmol/L (ref 3.5–5.1)
Sodium: 132 mmol/L — ABNORMAL LOW (ref 135–145)
TCO2: 24 mmol/L (ref 22–32)

## 2023-01-13 LAB — CBG MONITORING, ED
Glucose-Capillary: 406 mg/dL — ABNORMAL HIGH (ref 70–99)
Glucose-Capillary: 266 mg/dL — ABNORMAL HIGH (ref 70–99)
Glucose-Capillary: 270 mg/dL — ABNORMAL HIGH (ref 70–99)

## 2023-01-13 LAB — CBC
HCT: 44.5 % (ref 39.0–52.0)
Hemoglobin: 15.9 g/dL (ref 13.0–17.0)
MCH: 28.4 pg (ref 26.0–34.0)
MCHC: 35.7 g/dL (ref 30.0–36.0)
MCV: 79.5 fL — ABNORMAL LOW (ref 80.0–100.0)
Platelets: 310 10*3/uL (ref 150–400)
RBC: 5.6 MIL/uL (ref 4.22–5.81)
RDW: 12.1 % (ref 11.5–15.5)
WBC: 6.5 10*3/uL (ref 4.0–10.5)
nRBC: 0 % (ref 0.0–0.2)

## 2023-01-13 LAB — LIPASE, BLOOD: Lipase: 28 U/L (ref 11–51)

## 2023-01-13 LAB — URINALYSIS, ROUTINE W REFLEX MICROSCOPIC
Bacteria, UA: NONE SEEN
Bilirubin Urine: NEGATIVE
Glucose, UA: >=500 mg/dL — AB
Hgb urine dipstick: NEGATIVE
Ketones, ur: 20 mg/dL — AB
Leukocytes,Ua: NEGATIVE
Nitrite: NEGATIVE
Protein, ur: NEGATIVE mg/dL
Specific Gravity, Urine: 1.023 (ref 1.005–1.030)
pH: 6 (ref 5.0–8.0)

## 2023-01-13 LAB — COMPREHENSIVE METABOLIC PANEL
ALT: 25 U/L (ref 0–44)
AST: 20 U/L (ref 15–41)
Albumin: 4.1 g/dL (ref 3.5–5.0)
Alkaline Phosphatase: 104 U/L (ref 38–126)
Anion gap: 10 (ref 5–15)
BUN: 16 mg/dL (ref 6–20)
CO2: 23 mmol/L (ref 22–32)
Calcium: 9 mg/dL (ref 8.9–10.3)
Chloride: 97 mmol/L — ABNORMAL LOW (ref 98–111)
Creatinine, Ser: 0.57 mg/dL — ABNORMAL LOW (ref 0.61–1.24)
GFR, Estimated: >60 mL/min (ref >60)
Glucose, Bld: 343 mg/dL — ABNORMAL HIGH (ref 70–99)
Potassium: 3.6 mmol/L (ref 3.5–5.1)
Sodium: 130 mmol/L — ABNORMAL LOW (ref 135–145)
Total Bilirubin: 1.2 mg/dL (ref 0.3–1.2)
Total Protein: 7.1 g/dL (ref 6.5–8.1)

## 2023-01-13 LAB — BLOOD GAS, VENOUS
Acid-Base Excess: 6.6 mmol/L — ABNORMAL HIGH (ref 0.0–2.0)
Bicarbonate: 30.5 mmol/L — ABNORMAL HIGH (ref 20.0–28.0)
O2 Saturation: 95.7 %
Patient temperature: 37
pCO2, Ven: 40 mm[Hg] — ABNORMAL LOW (ref 44–60)
pH, Ven: 7.49 — ABNORMAL HIGH (ref 7.25–7.43)
pO2, Ven: 71 mm[Hg] — ABNORMAL HIGH (ref 32–45)

## 2023-01-13 LAB — BETA-HYDROXYBUTYRIC ACID: Beta-Hydroxybutyric Acid: 0.69 mmol/L — ABNORMAL HIGH (ref 0.05–0.27)

## 2023-01-13 MED ORDER — INSULIN ASPART 100 UNIT/ML IJ SOLN
0.0000 [IU] | Freq: Three times a day (TID) | INTRAMUSCULAR | Status: DC
  Filled 2023-01-13: qty 0.09

## 2023-01-13 MED ORDER — PROPRANOLOL HCL 20 MG PO TABS: 40.0000 mg | ORAL_TABLET | Freq: Two times a day (BID) | ORAL | Status: DC

## 2023-01-13 MED ORDER — ACETAMINOPHEN 500 MG PO TABS: 500.0000 mg | ORAL_TABLET | Freq: Four times a day (QID) | ORAL | Status: DC | PRN

## 2023-01-13 MED ORDER — LACTATED RINGERS IV BOLUS
1000.0000 mL | Freq: Once | INTRAVENOUS | Status: AC
  Administered 2023-01-13: 1000 mL via INTRAVENOUS

## 2023-01-13 MED ORDER — ESCITALOPRAM OXALATE 10 MG PO TABS
5.0000 mg | ORAL_TABLET | Freq: Every day | ORAL | Status: DC
  Administered 2023-01-13 – 2023-01-15 (×3): 5 mg via ORAL
  Filled 2023-01-13 (×3): qty 1

## 2023-01-13 MED ORDER — INSULIN ASPART 100 UNIT/ML IJ SOLN
0.0000 [IU] | Freq: Every day | INTRAMUSCULAR | Status: DC
  Filled 2023-01-13: qty 0.05

## 2023-01-13 MED ORDER — INSULIN ASPART 100 UNIT/ML IJ SOLN
0.0000 [IU] | Freq: Every day | INTRAMUSCULAR | Status: DC
  Administered 2023-01-13: 3 [IU] via SUBCUTANEOUS
  Administered 2023-01-14: 5 [IU] via SUBCUTANEOUS
  Filled 2023-01-13: qty 0.05

## 2023-01-13 MED ORDER — ARIPIPRAZOLE 10 MG PO TABS
20.0000 mg | ORAL_TABLET | Freq: Every day | ORAL | Status: DC
  Administered 2023-01-13 – 2023-01-14 (×2): 20 mg via ORAL
  Filled 2023-01-13 (×2): qty 2

## 2023-01-13 MED ORDER — PANTOPRAZOLE SODIUM 40 MG PO TBEC
40.0000 mg | DELAYED_RELEASE_TABLET | Freq: Every day | ORAL | Status: DC
  Administered 2023-01-13 – 2023-01-15 (×3): 40 mg via ORAL
  Filled 2023-01-13 (×3): qty 1

## 2023-01-13 MED ORDER — INSULIN ASPART 100 UNIT/ML IJ SOLN
8.0000 [IU] | Freq: Once | INTRAMUSCULAR | Status: AC
  Administered 2023-01-13: 8 [IU] via SUBCUTANEOUS
  Filled 2023-01-13: qty 0.08

## 2023-01-13 MED ORDER — INSULIN ASPART 100 UNIT/ML IJ SOLN
0.0000 [IU] | Freq: Three times a day (TID) | INTRAMUSCULAR | Status: DC
  Administered 2023-01-14: 4 [IU] via SUBCUTANEOUS
  Administered 2023-01-14 (×2): 5 [IU] via SUBCUTANEOUS
  Administered 2023-01-15: 7 [IU] via SUBCUTANEOUS
  Administered 2023-01-15: 5 [IU] via SUBCUTANEOUS
  Filled 2023-01-13: qty 0.09

## 2023-01-13 MED ORDER — PROPRANOLOL HCL 20 MG PO TABS
40.0000 mg | ORAL_TABLET | Freq: Two times a day (BID) | ORAL | Status: DC
  Administered 2023-01-13 – 2023-01-15 (×4): 40 mg via ORAL
  Filled 2023-01-13 (×4): qty 2

## 2023-01-13 MED ORDER — MELATONIN 5 MG PO TABS
5.0000 mg | ORAL_TABLET | Freq: Every day | ORAL | Status: DC
  Administered 2023-01-13 – 2023-01-14 (×2): 5 mg via ORAL
  Filled 2023-01-13 (×2): qty 1

## 2023-01-13 MED ORDER — ONDANSETRON HCL 4 MG/2ML IJ SOLN
4.0000 mg | Freq: Once | INTRAMUSCULAR | Status: AC
  Administered 2023-01-13: 4 mg via INTRAVENOUS
  Filled 2023-01-13: qty 2

## 2023-01-13 MED ORDER — INSULIN GLARGINE-YFGN 100 UNIT/ML ~~LOC~~ SOLN
40.0000 [IU] | Freq: Every day | SUBCUTANEOUS | Status: DC
  Administered 2023-01-13 – 2023-01-14 (×2): 40 [IU] via SUBCUTANEOUS
  Filled 2023-01-13 (×4): qty 0.4

## 2023-01-13 NOTE — ED Provider Notes (Signed)
[place] EMERGENCY DEPARTMENT AT [facility] Provider Note   CSN: [id number] Arrival date & time: 01/13/23  1313     History  Chief Complaint  Patient presents with   Hyperglycemia    Danny Werner is a 27 y.o. male with past medical history of anxiety, depression, bipolar disorder, type 1 diabetes presenting to emergency room with hyperglycemia this been ongoing for 4 days.  Patient reports has been taking his insulin as prescribed at home.  However his blood sugar has been running in the 400s 500s past 4 days.  Patient reports increased thirst and increased urination.  Denies current abdominal pain, no chest pain, no shortness of breath, no headache.  Denies any recent illness.  Patient is also reporting to have suicidal ideations, patient has a plan of overdosing.  Denies taking any medications today.  Does not feel down depressed or hopeless.  Has been admitted in the past for SI.  Reports grandmother was monitoring medications and he has been taking them regularly.    Hyperglycemia      Home Medications Prior to Admission medications   Medication Sig Start Date End Date Taking? Authorizing Provider  acetaminophen (TYLENOL) 500 MG tablet Take 500-1,000 mg by mouth every 6 (six) hours as needed for mild pain or headache.    [provider]  ARIPiprazole (ABILIFY) 20 MG tablet Take 20 mg by mouth at bedtime.    [provider]  Continuous Blood Gluc Sensor (DEXCOM G7 SENSOR) MISC Inject 1 Device into the skin See admin instructions. Place 1 device into the skin every 10 days    [provider]  EPINEPHrine 0.3 mg/0.3 mL IJ SOAJ injection Inject 0.3 mg into the skin once as needed for anaphylaxis. Inject into the middle of the outer thigh and hold for 3 seconds as needed for severe allergic reaction then call 911 if used. 10/18/20   [provider]  guanFACINE (INTUNIV) 4 MG TB24 ER tablet Take 4 mg by mouth at bedtime. 03/28/21    [provider]  hydrOXYzine (VISTARIL) 25 MG capsule Take 25 mg by mouth in the morning and at bedtime. 09/07/21   [provider]  insulin aspart (NOVOLOG) 100 UNIT/ML injection Inject 0-9 Units into the skin 3 (three) times daily with meals. Patient not taking: Reported on 10/05/2022 11/28/21   [name], MD  insulin aspart (NOVOLOG) 100 UNIT/ML injection Inject 0-5 Units into the skin at bedtime. Patient not taking: Reported on 10/05/2022 11/28/21   [name], MD  insulin aspart (NOVOLOG) 100 UNIT/ML injection Inject 6 Units into the skin 3 (three) times daily with meals. 11/28/21 12/28/21  [name], [name], MD  insulin lispro (HUMALOG) 100 UNIT/ML injection Inject into the skin See admin instructions. For use with insulin pump and bolus 15 units after each meal OR AS OTHERWISE INSTRUCTED 04/27/21   [provider]  Melatonin 10 MG CAPS Take 1 capsule by mouth at bedtime. 03/19/22   [provider]  omeprazole (PRILOSEC) 40 MG capsule Take 40 mg by mouth daily.    [provider]  propranolol ER (INDERAL LA) 120 MG 24 hr capsule Take 120 mg by mouth in the morning and at bedtime.    [provider]  traZODone (DESYREL) 50 MG tablet Take 50 mg by mouth at bedtime. 09/21/22   [provider]      Allergies    Bee venom, Codeine, Penicillins, and Promethazine hcl    Review of Systems  Review of Systems  Psychiatric/Behavioral:  Positive for suicidal ideas.     Physical Exam Updated Vital Signs BP 136/80 (BP Location: Right Arm)   Pulse 83   Temp 98.3 F (36.8 C) (Oral)   Resp 16   Ht 5\' 4"  (1.626 m)   Wt 72.6 kg   SpO2 96%   BMI 27.46 kg/m  Physical Exam Vitals and nursing note reviewed.  Constitutional:      General: He is not in acute distress.    Appearance: He is not ill-appearing, toxic-appearing or diaphoretic.  HENT:     Head: Normocephalic and atraumatic.     Nose: No rhinorrhea.  Eyes:     General: No  scleral icterus.    Conjunctiva/sclera: Conjunctivae normal.  Cardiovascular:     Rate and Rhythm: Normal rate and regular rhythm.     Pulses: Normal pulses.     Heart sounds: Normal heart sounds.  Pulmonary:     Effort: Pulmonary effort is normal. No respiratory distress.     Breath sounds: Normal breath sounds. No stridor. No wheezing or rales.  Abdominal:     General: Abdomen is flat. Bowel sounds are normal. There is no distension.     Palpations: Abdomen is soft. There is no mass.     Tenderness: There is no abdominal tenderness. There is no guarding.  Musculoskeletal:     Right lower leg: No edema.     Left lower leg: No edema.  Skin:    General: Skin is warm and dry.     Capillary Refill: Capillary refill takes less than 2 seconds.     Findings: No lesion.  Neurological:     General: No focal deficit present.     Mental Status: He is alert and oriented to person, place, and time. Mental status is at baseline.     Sensory: No sensory deficit.     Motor: No weakness.     ED Results / Procedures / Treatments   Labs (all labs ordered are listed, but only abnormal results are displayed) Labs Reviewed  CBG MONITORING, ED - Abnormal; Notable for the following components:      Result Value   Glucose-Capillary 406 (*)    All other components within normal limits  CBC  URINALYSIS, ROUTINE W REFLEX MICROSCOPIC    EKG None  Radiology No results found.  Procedures Procedures    Medications Ordered in ED Medications - No data to display  ED Course/ Medical Decision Making/ A&P Clinical Course as of 01/13/23 1833  Sun Jan 13, 2023  1734 Glucose-Capillary(!): 270 [JB]    Clinical Course User Index [JB] Danny Werner, Danny Chestnut, PA-C                                 Medical Decision Making Amount and/or Complexity of Data Reviewed Labs: ordered. Decision-making details documented in ED Course.  Risk Prescription drug management.   Earle Gell 27 y.o. presented  today for hyperglycemia. Working DDx includes, but not limited to, gastroenteritis, colitis, hypoglycemia, DKA, hydration, electrolyte abnormality, noncompliance  R/o DDx: These are considered less likely than current impression due to history of present illness, physical exam, labs/imaging findings.  Review of prior external notes: 11/25/21 suggesting plan ---  Semglee 35 units QD - give 1-2H prior to discontinuation of drip. Novolog 0-9 units x 4H x 12H, then TID with meals and 0-5 HS Novolog 6 units TID  with meals  Pmhx: Type 1 diabetes  Unique Tests and My Interpretation:  CBC : no anemia , no elevated white blood cell count CMP: Na 130, K wnl, glucose 343. No elevated anion gap  B hydroxybutyric acid 0.69 Vbg: pH 7.49 bicarb 30  CBG 406 Lipase: WNL  UA: glucose >500, ketones 20     Imaging: None   Problem List / ED Course / Critical interventions / Medication management  Patient reporting to the emergency room with 4 days of hyperglycemia.  Lab work is overall reassuring and does not show sign of DKA.  Patient does not have elevated anion gap and electrolytes are within normal limits.  After 1 L of fluids, patient reports he still feeling hydrated.  Appears dry giving second liter.  Rechecking blood glucose to see how you respond to the 8 units of insulin.  Contact TTS due to SI with plan.  Consult to diabetes specialist as patient does not have Omnipod working. Will use prior insulin plan from admission 11/25/21 until speaking with diabetes coordinator.  Overall patient's labs are reassuring and do not suggest DKA.  Patient does not have elevated anion gap and is not acidotic. I ordered medication including LR, zofran, inuslin  Reevaluation of the patient after these medicines showed that the patient improved Patients vitals assessed. Upon arrival patient is hemodynamically stable.  I have reviewed the patients home medicines and have made adjustments as needed  Consult: BH,  patient is medically cleared and okay to admit. Will need to have management of insulin   Plan:  [facility] admission for SI Home medications ordered Passed off to Dr Rodena Medin          Final Clinical Impression(s) / ED Diagnoses Final diagnoses:  None    Rx / DC Orders ED Discharge Orders     None         Raford Pitcher Evalee Jefferson 01/13/23 2005    Wynetta Fines, MD 01/13/23 [phone]

## 2023-01-13 NOTE — ED Notes (Signed)
Pt's belongings in cabinet 16/18.

## 2023-01-13 NOTE — ED Notes (Signed)
Pt stated that he could not urinate.   RN aware.

## 2023-01-13 NOTE — ED Triage Notes (Signed)
Pt arrived from home by EMS. Per patient SI with plan to overdose. Has been hyperglycemic for four days. Came in via EMS with BG 564. VSS. Given of NS by EMS. Per EMS BP 110/80. Patient does not appear visibly distressed.

## 2023-01-13 NOTE — Consult Note (Cosign Needed Addendum)
[facility] ED ASSESSMENT   Reason for Consult:  Psychiatry evaluation Referring Physician:  ER Physician Patient Identification: Danny Werner MRN:  [id number] ED Chief Complaint: Major depressive disorder, recurrent severe without psychotic features (HCC)  Diagnosis:  Principal Problem:   Major depressive disorder, recurrent severe without psychotic features (HCC) Active Problems:   Current moderate episode of major depressive disorder (HCC)   Suicide ideation   ED Assessment Time Calculation: Start Time: 1835 Stop Time: 1902 Total Time in Minutes (Assessment Completion): 27   Subjective:   Danny Werner is a 27 y.o. male patient admitted with previous hx of Depression, Bipolar disorder, anxiety and PTSD brought in by EMS for hyper Glycemia and also feeling suicidal..  HPI:  Patient is a male, 27 years old unemployed who came in for symptoms associated with elevated blood sugar.  He also reports recent stress related to losing his best friend who was recently deployed to [place] as an Tourist information centre manager.  He had heart issues that led to heart monitor study and with all these he felt he is worthless and helpless.  He is not working due to drug induced Tremors.  He said he has been dealing with Mental illness and at a time was taking Abilify.  He currently lives with his grand parents.  He sees a Therapist, sports at [employer].  Patient also states he started getting worried about feeling suicidal due to strong family hx of suicide.  His Father's sister and mother committed suicide and he is afraid of this happening to him.  Patient reports poor sleep but appetite is good he says.  He attributes his suicidal ideation today to his elevated blood sugar.  Blood sugar was 431 on arrival to the ER.  It has since come down to near Normal with Insulin.   Patient is alert and oriented x5, he has good insight in his DM and understands that correct diet is the key to maintaining stable blood  sugar reading.  We discussed the need to come into the Psych unit for few days for stabilization.  Patient also agrees to the recommendation.  We will resume Home medications and seek bed placement.  He currently denied SI/HI/AVH and has no previous suicide attempt.    Past Psychiatric History: Depression, Bipolar disorder, anxiety and PTSD.  Dolores Lory are at [facility] Psychiatry.  No previous Psychiatry inpatient Hospitalization.  He spent a night at [facility] observation unit in January.  Risk to Self or Others: Is the patient at risk to self? No Has the patient been a risk to self in the past 6 months? No Has the patient been a risk to self within the distant past? No Is the patient a risk to others? No Has the patient been a risk to others in the past 6 months? No Has the patient been a risk to others within the distant past? No  [place] Scale:  Flowsheet Row ED from 01/13/2023 in [facility] Emergency Department at [facility] ED from 10/04/2022 in [facility] Emergency Department at [facility] ED from 04/01/2022 in [facility] Emergency Department at [facility]  C-SSRS RISK CATEGORY High Risk No Risk No Risk       AIMS:  , , ,  ,   ASAM:    Substance Abuse:     Past Medical History:  Past Medical History:  Diagnosis Date   ADHD (attention deficit hyperactivity disorder)    Anxiety  Bipolar disorder (HCC)    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   Depression    Depression    Diabetes mellitus without complication (HCC)    Diabetes mellitus, type II (HCC)    Intermittent explosive disorder    Migraines    Trauma    Tremor    Tumor    left shoulfer    Past Surgical History:  Procedure Laterality Date   ADENOIDECTOMY     ANAL RECTAL MANOMETRY N/A 01/18/2016   Procedure: ANO RECTAL MANOMETRY;  Surgeon: Willis Modena, MD;  Location: [facility] ENDOSCOPY;  Service: Endoscopy;  Laterality: N/A;   FRACTURE SURGERY     MOUTH SURGERY      TONSILLECTOMY     TUMOR REMOVAL Left    shoulder-removed age 62   Family History:  Family History  Problem Relation Age of Onset   Asthma Mother    Depression Father    Bipolar disorder Father    Depression Paternal Aunt    Depression Maternal Uncle    Depression Paternal Grandfather    Dementia Paternal Grandmother    Depression Paternal Grandmother    Depression Cousin    Diabetes Neg Hx    Family Psychiatric  History: Father's sister and Mother died from  suicide. Social History:  Social History   Substance and Sexual Activity  Alcohol Use No     Social History   Substance and Sexual Activity  Drug Use No    Social History   Socioeconomic History   Marital status: Single    Spouse name: Not on file   Number of children: 0   Years of education: Not on file   Highest education level: High school graduate  Occupational History   Not on file  Tobacco Use   Smoking status: Every Day    Types: E-cigarettes   Smokeless tobacco: Never  Vaping Use   Vaping status: Every Day  Substance and Sexual Activity   Alcohol use: No   Drug use: No   Sexual activity: Not on file  Other Topics Concern   Not on file  Social History Narrative   Lives with Grandparents   Left handed    Social Determinants of Health   Financial Resource Strain: Not on file  Food Insecurity: Not on file  Transportation Needs: Not on file  Physical Activity: Not on file  Stress: Not on file  Social Connections: Not on file   Additional Social History:    Allergies:   Allergies  Allergen Reactions   Bee Venom Anaphylaxis and Swelling   Codeine Other (See Comments)    Seizures    Penicillins Other (See Comments)    Seizures  Has patient had a PCN reaction causing immediate rash, facial/tongue/throat swelling, SOB or lightheadedness with hypotension: No Has patient had a PCN reaction causing severe rash involving mucus membranes or skin necrosis: No Has patient had a PCN reaction  that required hospitalization: Yes Has patient had a PCN reaction occurring within the last 10 years: No If all of the above answers are "NO", then may proceed with Cephalosporin use.    Promethazine Hcl Other (See Comments)    Seizures    Labs:  Results for orders placed or performed during the hospital encounter of 01/13/23 (from the past 48 hour(s))  CBG monitoring, ED     Status: Abnormal   Collection Time: 01/13/23  1:25 PM  Result Value Ref Range   Glucose-Capillary 406 (H) 70 - 99  mg/dL    Comment: Glucose reference range applies only to samples taken after fasting for at least 8 hours.  CBC     Status: Abnormal   Collection Time: 01/13/23  1:31 PM  Result Value Ref Range   WBC 6.5 4.0 - 10.5 K/uL   RBC 5.60 4.22 - 5.81 MIL/uL   Hemoglobin 15.9 13.0 - 17.0 g/dL   HCT 95.6 21.3 - 08.6 %   MCV 79.5 (L) 80.0 - 100.0 fL   MCH 28.4 26.0 - 34.0 pg   MCHC 35.7 30.0 - 36.0 g/dL   RDW 57.8 46.9 - 62.9 %   Platelets 310 150 - 400 K/uL   nRBC 0.0 0.0 - 0.2 %    Comment: Performed at [facility], [address], [place], [place] [address]  I-stat chem 8, ed     Status: Abnormal   Collection Time: 01/13/23  1:44 PM  Result Value Ref Range   Sodium 132 (L) 135 - 145 mmol/L   Potassium 3.8 3.5 - 5.1 mmol/L   Chloride 98 98 - 111 mmol/L   BUN 17 6 - 20 mg/dL   Creatinine, Ser 3.24 0.61 - 1.24 mg/dL   Glucose, Bld 401 (H) 70 - 99 mg/dL    Comment: Glucose reference range applies only to samples taken after fasting for at least 8 hours.   Calcium, Ion 1.17 1.15 - 1.40 mmol/L   TCO2 24 22 - 32 mmol/L   Hemoglobin 16.3 13.0 - 17.0 g/dL   HCT 02.7 25.3 - 66.4 %  Blood gas, venous (at [facility])     Status: Abnormal   Collection Time: 01/13/23  2:42 PM  Result Value Ref Range   pH, Ven 7.49 (H) 7.25 - 7.43   pCO2, Ven 40 (L) 44 - 60 mmHg   pO2, Ven 71 (H) 32 - 45 mmHg   Bicarbonate 30.5 (H) 20.0 - 28.0 mmol/L   Acid-Base Excess 6.6 (H) 0.0 - 2.0 mmol/L   O2  Saturation 95.7 %   Patient temperature 37.0     Comment: Performed at [facility], [address], [place], [place] [address]  Beta-hydroxybutyric acid     Status: Abnormal   Collection Time: 01/13/23  2:42 PM  Result Value Ref Range   Beta-Hydroxybutyric Acid 0.69 (H) 0.05 - 0.27 mmol/L    Comment: Performed at [facility], [address] [place], [place] [address]  Comprehensive metabolic panel     Status: Abnormal   Collection Time: 01/13/23  2:42 PM  Result Value Ref Range   Sodium 130 (L) 135 - 145 mmol/L   Potassium 3.6 3.5 - 5.1 mmol/L   Chloride 97 (L) 98 - 111 mmol/L   CO2 23 22 - 32 mmol/L   Glucose, Bld 343 (H) 70 - 99 mg/dL    Comment: Glucose reference range applies only to samples taken after fasting for at least 8 hours.   BUN 16 6 - 20 mg/dL   Creatinine, Ser 6.38 (L) 0.61 - 1.24 mg/dL   Calcium 9.0 8.9 - 75.6 mg/dL   Total Protein 7.1 6.5 - 8.1 g/dL   Albumin 4.1 3.5 - 5.0 g/dL   AST 20 15 - 41 U/L   ALT 25 0 - 44 U/L   Alkaline Phosphatase 104 38 - 126 U/L   Total Bilirubin 1.2 0.3 - 1.2 mg/dL   GFR, Estimated >43 >32 mL/min    Comment: (NOTE) Calculated using the CKD-EPI  Creatinine Equation (2021)    Anion gap 10 5 - 15    Comment: Performed at [facility], [address] [place], [place] [address]  Lipase, blood     Status: None   Collection Time: 01/13/23  2:42 PM  Result Value Ref Range   Lipase 28 11 - 51 U/L    Comment: Performed at [facility], [address], [place], [place] [address]  Urinalysis, Routine w reflex microscopic -Urine, Clean Catch     Status: Abnormal   Collection Time: 01/13/23  4:35 PM  Result Value Ref Range   Color, Urine COLORLESS (A) YELLOW   APPearance CLEAR CLEAR   Specific Gravity, Urine 1.023 1.005 - 1.030   pH 6.0 5.0 - 8.0   Glucose, UA >=500 (A) NEGATIVE mg/dL   Hgb urine dipstick NEGATIVE NEGATIVE   Bilirubin Urine NEGATIVE NEGATIVE    Ketones, ur 20 (A) NEGATIVE mg/dL   Protein, ur NEGATIVE NEGATIVE mg/dL   Nitrite NEGATIVE NEGATIVE   Leukocytes,Ua NEGATIVE NEGATIVE   RBC / HPF 0-5 0 - 5 RBC/hpf   WBC, UA 0-5 0 - 5 WBC/hpf   Bacteria, UA NONE SEEN NONE SEEN   Squamous Epithelial / HPF 0-5 0 - 5 /HPF    Comment: Performed at [facility], [address], [place], [place] [address]  CBG monitoring, ED     Status: Abnormal   Collection Time: 01/13/23  4:37 PM  Result Value Ref Range   Glucose-Capillary 266 (H) 70 - 99 mg/dL    Comment: Glucose reference range applies only to samples taken after fasting for at least 8 hours.  CBG monitoring, ED     Status: Abnormal   Collection Time: 01/13/23  5:12 PM  Result Value Ref Range   Glucose-Capillary 270 (H) 70 - 99 mg/dL    Comment: Glucose reference range applies only to samples taken after fasting for at least 8 hours.    No current facility-administered medications for this encounter.   Current Outpatient Medications  Medication Sig Dispense Refill   acetaminophen (TYLENOL) 500 MG tablet Take 500-1,000 mg by mouth every 6 (six) hours as needed for mild pain or headache.     ARIPiprazole (ABILIFY) 20 MG tablet Take 20 mg by mouth at bedtime.     Continuous Blood Gluc Sensor (DEXCOM G7 SENSOR) MISC Inject 1 Device into the skin See admin instructions. Place 1 device into the skin every 10 days     EPINEPHrine 0.3 mg/0.3 mL IJ SOAJ injection Inject 0.3 mg into the skin once as needed for anaphylaxis. Inject into the middle of the outer thigh and hold for 3 seconds as needed for severe allergic reaction then call 911 if used.     guanFACINE (INTUNIV) 4 MG TB24 ER tablet Take 4 mg by mouth at bedtime.     hydrOXYzine (VISTARIL) 25 MG capsule Take 25 mg by mouth in the morning and at bedtime.     insulin aspart (NOVOLOG) 100 UNIT/ML injection Inject 0-9 Units into the skin 3 (three) times daily with meals. (Patient not taking: Reported on 10/05/2022) 10 mL  11   insulin aspart (NOVOLOG) 100 UNIT/ML injection Inject 0-5 Units into the skin at bedtime. (Patient not taking: Reported on 10/05/2022) 10 mL 11   insulin aspart (NOVOLOG) 100 UNIT/ML injection Inject 6 Units into the skin 3 (three) times daily with meals. 5.4 mL 0   insulin lispro (HUMALOG) 100 UNIT/ML injection Inject into the  skin See admin instructions. For use with insulin pump and bolus 15 units after each meal OR AS OTHERWISE INSTRUCTED     Melatonin 10 MG CAPS Take 1 capsule by mouth at bedtime.     omeprazole (PRILOSEC) 40 MG capsule Take 40 mg by mouth daily.     propranolol ER (INDERAL LA) 120 MG 24 hr capsule Take 120 mg by mouth in the morning and at bedtime.     traZODone (DESYREL) 50 MG tablet Take 50 mg by mouth at bedtime.      Musculoskeletal: Strength & Muscle Tone: within normal limits Gait & Station: normal Patient leans: Front   Psychiatric Specialty Exam: Presentation  General Appearance:  Casual; Neat  Eye Contact: Good  Speech: Clear and Coherent; Normal Rate  Speech Volume: Normal  Handedness: Right   Mood and Affect  Mood: Depressed; Anxious  Affect: Congruent   Thought Process  Thought Processes: Coherent; Goal Directed  Descriptions of Associations:Intact  Orientation:Full (Time, Place and Person)  Thought Content:Logical  History of Schizophrenia/Schizoaffective disorder:No data recorded Duration of Psychotic Symptoms:No data recorded Hallucinations:Hallucinations: None  Ideas of Reference:None  Suicidal Thoughts:Suicidal Thoughts: No  Homicidal Thoughts:Homicidal Thoughts: No   Sensorium  Memory: Immediate Good; Recent Good; Remote Good  Judgment: Good  Insight: Good   Executive Functions  Concentration: Good  Attention Span: Good  Recall: Good  Fund of Knowledge: Good  Language: Good   Psychomotor Activity  Psychomotor Activity: Psychomotor Activity: Normal   Assets   Assets: Communication Skills; Desire for Improvement; Housing    Sleep  Sleep: Sleep: Poor   Physical Exam: Physical Exam Vitals and nursing note reviewed.  Constitutional:      Appearance: Normal appearance.  HENT:     Head: Normocephalic.     Nose: Nose normal.  Cardiovascular:     Rate and Rhythm: Normal rate and regular rhythm.  Pulmonary:     Effort: Pulmonary effort is normal.  Musculoskeletal:        General: Normal range of motion.     Cervical back: Normal range of motion.  Skin:    General: Skin is dry.  Neurological:     Mental Status: He is alert and oriented to person, place, and time.  Psychiatric:        Attention and Perception: Attention and perception normal.        Mood and Affect: Mood is anxious and depressed.        Speech: Speech normal.        Behavior: Behavior normal. Behavior is cooperative.        Thought Content: Thought content normal.        Judgment: Judgment normal.    Review of Systems  Constitutional: Negative.   HENT: Negative.    Eyes: Negative.   Respiratory: Negative.    Cardiovascular: Negative.   Gastrointestinal: Negative.   Genitourinary: Negative.   Musculoskeletal: Negative.   Skin: Negative.   Neurological: Negative.   Endo/Heme/Allergies: Negative.   Psychiatric/Behavioral:  Positive for depression and suicidal ideas. The patient is nervous/anxious.    Blood pressure 110/60, pulse 73, temperature 98.2 F (36.8 C), temperature source Oral, resp. rate 16, height 5\' 4"  (1.626 m), weight 72.6 kg, SpO2 95%. Body mass index is 27.46 kg/m.  Medical Decision Making: Patient denies SI/HI/AVH but based on strong family hx of two paternal immediate relatives completing suicide we will bring him in to the Psychiatry unit for stabilization and safety.   His best friend left  for [place] as an Tourist information centre manager and he states he misses his friend as well. Patient is willing to come in voluntarily too.  We will seek bed for admission  and will resume home Medications.  Problem 1: Recurrent Major Depressive disorder, severe with out Psychotic features.  Problem 2: Suicide ideation.  Disposition:  admit, seek bed placement.  Earney Navy, NP-PMHNP-BC 01/13/2023 7:07 PM

## 2023-01-14 LAB — CBG MONITORING, ED
Glucose-Capillary: 253 mg/dL — ABNORMAL HIGH (ref 70–99)
Glucose-Capillary: 286 mg/dL — ABNORMAL HIGH (ref 70–99)
Glucose-Capillary: 251 mg/dL — ABNORMAL HIGH (ref 70–99)
Glucose-Capillary: 305 mg/dL — ABNORMAL HIGH (ref 70–99)
Glucose-Capillary: 259 mg/dL — ABNORMAL HIGH (ref 70–99)
Glucose-Capillary: 361 mg/dL — ABNORMAL HIGH (ref 70–99)

## 2023-01-14 MED ORDER — NICOTINE 21 MG/24HR TD PT24: 21.0000 mg | MEDICATED_PATCH | Freq: Once | TRANSDERMAL | Status: DC

## 2023-01-14 MED ORDER — CARIPRAZINE HCL 1.5 MG PO CAPS
1.5000 mg | ORAL_CAPSULE | Freq: Every day | ORAL | Status: DC
  Administered 2023-01-14 – 2023-01-15 (×2): 1.5 mg via ORAL
  Filled 2023-01-14 (×2): qty 1

## 2023-01-14 MED ORDER — BENZTROPINE MESYLATE 0.5 MG PO TABS
0.5000 mg | ORAL_TABLET | Freq: Every day | ORAL | Status: DC
  Administered 2023-01-14 – 2023-01-15 (×2): 0.5 mg via ORAL
  Filled 2023-01-14 (×2): qty 1

## 2023-01-14 MED ORDER — RAMIPRIL 5 MG PO CAPS
5.0000 mg | ORAL_CAPSULE | Freq: Every day | ORAL | Status: DC
  Administered 2023-01-14 – 2023-01-15 (×2): 5 mg via ORAL
  Filled 2023-01-14 (×2): qty 1

## 2023-01-14 MED ORDER — GUANFACINE HCL ER 1 MG PO TB24
4.0000 mg | ORAL_TABLET | Freq: Every day | ORAL | Status: DC
  Administered 2023-01-14 – 2023-01-15 (×2): 4 mg via ORAL
  Filled 2023-01-14 (×2): qty 4

## 2023-01-14 MED ORDER — HYDROXYZINE HCL 25 MG PO TABS
25.0000 mg | ORAL_TABLET | Freq: Two times a day (BID) | ORAL | Status: DC
  Administered 2023-01-14 – 2023-01-15 (×3): 25 mg via ORAL
  Filled 2023-01-14 (×3): qty 1

## 2023-01-14 MED ORDER — TRAZODONE HCL 50 MG PO TABS
50.0000 mg | ORAL_TABLET | Freq: Every day | ORAL | Status: DC
  Administered 2023-01-14: 50 mg via ORAL
  Filled 2023-01-14: qty 1

## 2023-01-14 MED ORDER — HYDROXYZINE PAMOATE 25 MG PO CAPS
25.0000 mg | ORAL_CAPSULE | Freq: Two times a day (BID) | ORAL | Status: DC
  Filled 2023-01-14: qty 1

## 2023-01-14 NOTE — Progress Notes (Signed)
[facility] Psych ED Progress Note  01/14/2023 6:05 PM Danny Werner  MRN:  [id number]   Subjective:  Danny Werner is a 27 y.o. male patient admitted with previous hx of Depression, Bipolar disorder, anxiety and PTSD brought in by EMS for hyper Glycemia and also feeling suicidal. Patient has been seen and active today.  His blood sugar is wnl  this evening.  He believes that elevated sugar must have affected his mood that led to him feeling suicidal.  He denies feeling suicidal and plans to make sure he has all all of his medications.  He also plans to engage in a diabetic Nurse for more education on the disease.  Patient is unemployed and lives with his grandparents.  We strongly believes he is not a danger to him self any more.  We are looking for bed for admission   to a Psychiatry unit to manage his Depression and anxiety.  He also has an outpatient Psychiatrist.   Patient continues to  take his home Medications.   Principal Problem: Major depressive disorder, recurrent severe without psychotic features (HCC) Diagnosis:  Principal Problem:   Major depressive disorder, recurrent severe without psychotic features (HCC) Active Problems:   Current moderate episode of major depressive disorder (HCC)   Suicide ideation   ED Assessment Time Calculation: Start Time: 1710 Stop Time: 1727 Total Time in Minutes (Assessment Completion): 17   Past Psychiatric History: See initial Psychiatry evaluation note  [place] Scale:  Flowsheet Row ED from 01/13/2023 in [facility] Emergency Department at [facility] ED from 10/04/2022 in [facility] Emergency Department at [facility] ED from 04/01/2022 in [facility] Emergency Department at [facility]  C-SSRS RISK CATEGORY High Risk No Risk No Risk       Past Medical History:  Past Medical History:  Diagnosis Date   ADHD (attention deficit hyperactivity disorder)    Anxiety    Bipolar disorder (HCC)    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   Depression    Depression    Diabetes mellitus without complication (HCC)    Diabetes mellitus, type II (HCC)    Intermittent explosive disorder    Migraines    Trauma    Tremor    Tumor    left shoulfer    Past Surgical History:  Procedure Laterality Date   ADENOIDECTOMY     ANAL RECTAL MANOMETRY N/A 01/18/2016   Procedure: ANO RECTAL MANOMETRY;  Surgeon: Willis Modena, MD;  Location: [facility] ENDOSCOPY;  Service: Endoscopy;  Laterality: N/A;   FRACTURE SURGERY     MOUTH SURGERY     TONSILLECTOMY     TUMOR REMOVAL Left    shoulder-removed age 73   Family History:  Family History  Problem Relation Age of Onset   Asthma Mother    Depression Father    Bipolar disorder Father    Depression Paternal Aunt    Depression Maternal Uncle    Depression Paternal Grandfather    Dementia Paternal Grandmother    Depression Paternal Grandmother    Depression Cousin    Diabetes Neg Hx    Family Psychiatric  History: See initial Psychiatry evaluation note Social History:  Social History   Substance and Sexual Activity  Alcohol Use No     Social History   Substance and Sexual Activity  Drug Use No    Social History   Socioeconomic History   Marital status: Single    Spouse name:  Not on file   Number of children: 0   Years of education: Not on file   Highest education level: High school graduate  Occupational History   Not on file  Tobacco Use   Smoking status: Every Day    Types: E-cigarettes   Smokeless tobacco: Never  Vaping Use   Vaping status: Every Day  Substance and Sexual Activity   Alcohol use: No   Drug use: No   Sexual activity: Not on file  Other Topics Concern   Not on file  Social History Narrative   Lives with Grandparents   Left handed    Social Determinants of Health   Financial Resource Strain: Not on file  Food Insecurity: Not on file  Transportation Needs: Not on file  Physical Activity: Not on file  Stress: Not on file  Social  Connections: Not on file    Sleep: Fair  Appetite:  Fair  Current Medications: Current Facility-Administered Medications  Medication Dose Route Frequency Provider Last Rate Last Admin   acetaminophen (TYLENOL) tablet 500-1,000 mg  500-1,000 mg Oral Q6H PRN Wynetta Fines, MD       ARIPiprazole (ABILIFY) tablet 20 mg  20 mg Oral QHS Wynetta Fines, MD   20 mg at 01/13/23 2217   benztropine (COGENTIN) tablet 0.5 mg  0.5 mg Oral Daily Plunkett, Whitney, MD   0.5 mg at 01/14/23 1340   cariprazine (VRAYLAR) capsule 1.5 mg  1.5 mg Oral Daily Plunkett, Whitney, MD   1.5 mg at 01/14/23 1340   escitalopram (LEXAPRO) tablet 5 mg  5 mg Oral Daily Zehra Rucci C, NP   5 mg at 01/14/23 0948   guanFACINE (INTUNIV) ER tablet 4 mg  4 mg Oral Daily Plunkett, Alphonzo Lemmings, MD   4 mg at 01/14/23 1339   hydrOXYzine (ATARAX) tablet 25 mg  25 mg Oral BID Len Childs T, RPH   25 mg at 01/14/23 1340   insulin aspart (novoLOG) injection 0-5 Units  0-5 Units Subcutaneous QHS Wynetta Fines, MD   3 Units at 01/13/23 2238   insulin aspart (novoLOG) injection 0-9 Units  0-9 Units Subcutaneous TID WC Wynetta Fines, MD   5 Units at 01/14/23 1725   insulin glargine-yfgn (SEMGLEE) injection 40 Units  40 Units Subcutaneous Daily Wynetta Fines, MD   40 Units at 01/13/23 2239   melatonin tablet 5 mg  5 mg Oral QHS Everitt Wenner C, NP   5 mg at 01/13/23 2217   pantoprazole (PROTONIX) EC tablet 40 mg  40 mg Oral Daily Wynetta Fines, MD   40 mg at 01/14/23 0948   propranolol (INDERAL) tablet 40 mg  40 mg Oral BID Wynetta Fines, MD   40 mg at 01/14/23 0950   ramipril (ALTACE) capsule 5 mg  5 mg Oral Daily Gwyneth Sprout, MD   5 mg at 01/14/23 1340   traZODone (DESYREL) tablet 50 mg  50 mg Oral QHS Gwyneth Sprout, MD       Current Outpatient Medications  Medication Sig Dispense Refill   acetaminophen (TYLENOL) 500 MG tablet Take 500-1,000 mg by mouth every 6 (six) hours as needed for mild pain or  headache.     ARIPiprazole (ABILIFY) 20 MG tablet Take 20 mg by mouth at bedtime.     benztropine (COGENTIN) 0.5 MG tablet Take 0.5 mg by mouth daily.     Continuous Blood Gluc Sensor (DEXCOM G7 SENSOR) MISC Inject 1 Device into the skin See admin  instructions. Place 1 device into the skin every 10 days     guanFACINE (INTUNIV) 4 MG TB24 ER tablet Take 4 mg by mouth at bedtime.     hydrOXYzine (VISTARIL) 25 MG capsule Take 25 mg by mouth in the morning and at bedtime.     Insulin Disposable Pump (OMNIPOD DASH PODS, GEN 4,) MISC Inject into the skin.     insulin lispro (HUMALOG) 100 UNIT/ML injection Inject into the skin See admin instructions. For use with insulin pump and bolus 15 units after each meal OR AS OTHERWISE INSTRUCTED     Melatonin 10 MG CAPS Take 1 capsule by mouth at bedtime.     omeprazole (PRILOSEC) 40 MG capsule Take 40 mg by mouth daily.     propranolol ER (INDERAL LA) 120 MG 24 hr capsule Take 120 mg by mouth in the morning and at bedtime.     ramipril (ALTACE) 5 MG capsule Take 5 mg by mouth daily.     traZODone (DESYREL) 50 MG tablet Take 50 mg by mouth at bedtime.     VRAYLAR 1.5 MG capsule Take 1.5 mg by mouth daily.     EPINEPHrine 0.3 mg/0.3 mL IJ SOAJ injection Inject 0.3 mg into the skin once as needed for anaphylaxis. Inject into the middle of the outer thigh and hold for 3 seconds as needed for severe allergic reaction then call 911 if used. (Patient not taking: Reported on 01/13/2023)      Lab Results:  Results for orders placed or performed during the hospital encounter of 01/13/23 (from the past 48 hour(s))  CBG monitoring, ED     Status: Abnormal   Collection Time: 01/13/23  1:25 PM  Result Value Ref Range   Glucose-Capillary 406 (H) 70 - 99 mg/dL    Comment: Glucose reference range applies only to samples taken after fasting for at least 8 hours.  CBC     Status: Abnormal   Collection Time: 01/13/23  1:31 PM  Result Value Ref Range   WBC 6.5 4.0 - 10.5 K/uL    RBC 5.60 4.22 - 5.81 MIL/uL   Hemoglobin 15.9 13.0 - 17.0 g/dL   HCT 16.1 09.6 - 04.5 %   MCV 79.5 (L) 80.0 - 100.0 fL   MCH 28.4 26.0 - 34.0 pg   MCHC 35.7 30.0 - 36.0 g/dL   RDW 40.9 81.1 - 91.4 %   Platelets 310 150 - 400 K/uL   nRBC 0.0 0.0 - 0.2 %    Comment: Performed at [facility], [address], [place], [place] [address]  I-stat chem 8, ed     Status: Abnormal   Collection Time: 01/13/23  1:44 PM  Result Value Ref Range   Sodium 132 (L) 135 - 145 mmol/L   Potassium 3.8 3.5 - 5.1 mmol/L   Chloride 98 98 - 111 mmol/L   BUN 17 6 - 20 mg/dL   Creatinine, Ser 6.21 0.61 - 1.24 mg/dL   Glucose, Bld 308 (H) 70 - 99 mg/dL    Comment: Glucose reference range applies only to samples taken after fasting for at least 8 hours.   Calcium, Ion 1.17 1.15 - 1.40 mmol/L   TCO2 24 22 - 32 mmol/L   Hemoglobin 16.3 13.0 - 17.0 g/dL   HCT 65.7 84.6 - 96.2 %  Blood gas, venous (at [facility])     Status: Abnormal   Collection Time: 01/13/23  2:42 PM  Result Value Ref Range  pH, Ven 7.49 (H) 7.25 - 7.43   pCO2, Ven 40 (L) 44 - 60 mmHg   pO2, Ven 71 (H) 32 - 45 mmHg   Bicarbonate 30.5 (H) 20.0 - 28.0 mmol/L   Acid-Base Excess 6.6 (H) 0.0 - 2.0 mmol/L   O2 Saturation 95.7 %   Patient temperature 37.0     Comment: Performed at [facility], [address], [place], [place] [address]  Beta-hydroxybutyric acid     Status: Abnormal   Collection Time: 01/13/23  2:42 PM  Result Value Ref Range   Beta-Hydroxybutyric Acid 0.69 (H) 0.05 - 0.27 mmol/L    Comment: Performed at [facility], [address], [place], [place] [address]  Comprehensive metabolic panel     Status: Abnormal   Collection Time: 01/13/23  2:42 PM  Result Value Ref Range   Sodium 130 (L) 135 - 145 mmol/L   Potassium 3.6 3.5 - 5.1 mmol/L   Chloride 97 (L) 98 - 111 mmol/L   CO2 23 22 - 32 mmol/L   Glucose, Bld 343 (H) 70 - 99 mg/dL    Comment: Glucose reference  range applies only to samples taken after fasting for at least 8 hours.   BUN 16 6 - 20 mg/dL   Creatinine, Ser 0.98 (L) 0.61 - 1.24 mg/dL   Calcium 9.0 8.9 - 11.9 mg/dL   Total Protein 7.1 6.5 - 8.1 g/dL   Albumin 4.1 3.5 - 5.0 g/dL   AST 20 15 - 41 U/L   ALT 25 0 - 44 U/L   Alkaline Phosphatase 104 38 - 126 U/L   Total Bilirubin 1.2 0.3 - 1.2 mg/dL   GFR, Estimated >14 >78 mL/min    Comment: (NOTE) Calculated using the CKD-EPI Creatinine Equation (2021)    Anion gap 10 5 - 15    Comment: Performed at [facility], [address] [place], [place] [address]  Lipase, blood     Status: None   Collection Time: 01/13/23  2:42 PM  Result Value Ref Range   Lipase 28 11 - 51 U/L    Comment: Performed at [facility], [address] [place], [place] [address]  Urinalysis, Routine w reflex microscopic -Urine, Clean Catch     Status: Abnormal   Collection Time: 01/13/23  4:35 PM  Result Value Ref Range   Color, Urine COLORLESS (A) YELLOW   APPearance CLEAR CLEAR   Specific Gravity, Urine 1.023 1.005 - 1.030   pH 6.0 5.0 - 8.0   Glucose, UA >=500 (A) NEGATIVE mg/dL   Hgb urine dipstick NEGATIVE NEGATIVE   Bilirubin Urine NEGATIVE NEGATIVE   Ketones, ur 20 (A) NEGATIVE mg/dL   Protein, ur NEGATIVE NEGATIVE mg/dL   Nitrite NEGATIVE NEGATIVE   Leukocytes,Ua NEGATIVE NEGATIVE   RBC / HPF 0-5 0 - 5 RBC/hpf   WBC, UA 0-5 0 - 5 WBC/hpf   Bacteria, UA NONE SEEN NONE SEEN   Squamous Epithelial / HPF 0-5 0 - 5 /HPF    Comment: Performed at [facility], [address] [place], [place] [address]  CBG monitoring, ED     Status: Abnormal   Collection Time: 01/13/23  4:37 PM  Result Value Ref Range   Glucose-Capillary 266 (H) 70 - 99 mg/dL    Comment: Glucose reference range applies only to samples taken after fasting for at least 8 hours.  CBG monitoring, ED     Status: Abnormal  Collection Time: 01/13/23  5:12 PM  Result Value Ref  Range   Glucose-Capillary 270 (H) 70 - 99 mg/dL    Comment: Glucose reference range applies only to samples taken after fasting for at least 8 hours.  CBG monitoring, ED     Status: Abnormal   Collection Time: 01/13/23 10:33 PM  Result Value Ref Range   Glucose-Capillary 253 (H) 70 - 99 mg/dL    Comment: Glucose reference range applies only to samples taken after fasting for at least 8 hours.  CBG monitoring, ED     Status: Abnormal   Collection Time: 01/14/23  6:26 AM  Result Value Ref Range   Glucose-Capillary 286 (H) 70 - 99 mg/dL    Comment: Glucose reference range applies only to samples taken after fasting for at least 8 hours.  CBG monitoring, ED     Status: Abnormal   Collection Time: 01/14/23  9:12 AM  Result Value Ref Range   Glucose-Capillary 251 (H) 70 - 99 mg/dL    Comment: Glucose reference range applies only to samples taken after fasting for at least 8 hours.  CBG monitoring, ED     Status: Abnormal   Collection Time: 01/14/23 12:26 PM  Result Value Ref Range   Glucose-Capillary 305 (H) 70 - 99 mg/dL    Comment: Glucose reference range applies only to samples taken after fasting for at least 8 hours.  CBG monitoring, ED     Status: Abnormal   Collection Time: 01/14/23  5:05 PM  Result Value Ref Range   Glucose-Capillary 259 (H) 70 - 99 mg/dL    Comment: Glucose reference range applies only to samples taken after fasting for at least 8 hours.    Blood Alcohol level:  Lab Results  Component Value Date   ETH <10 05/07/2021    Physical Findings:  CIWA:    COWS:     Musculoskeletal: Strength & Muscle Tone: within normal limits Gait & Station: normal Patient leans: Front  Psychiatric Specialty Exam:  Presentation  General Appearance:  Casual; Neat  Eye Contact: Good  Speech: Clear and Coherent; Normal Rate  Speech Volume: Normal  Handedness: Right   Mood and Affect  Mood: Depressed; Anxious  Affect: Congruent   Thought Process   Thought Processes: Goal Directed; Coherent  Descriptions of Associations:Intact  Orientation:Full (Time, Place and Person)  Thought Content:Logical  History of Schizophrenia/Schizoaffective disorder:No data recorded Duration of Psychotic Symptoms:No data recorded Hallucinations:Hallucinations: None  Ideas of Reference:None  Suicidal Thoughts:Suicidal Thoughts: No  Homicidal Thoughts:Homicidal Thoughts: No   Sensorium  Memory: Immediate Good; Recent Good; Remote Good  Judgment: Good  Insight: Good   Executive Functions  Concentration: Good  Attention Span: Good  Recall: Good  Fund of Knowledge: Good  Language: Good   Psychomotor Activity  Psychomotor Activity: Psychomotor Activity: Normal   Assets  Assets: Communication Skills; Desire for Improvement; Housing   Sleep  Sleep: Sleep: Fair    Physical Exam: Physical Exam Vitals and nursing note reviewed.  Constitutional:      Appearance: Normal appearance.  HENT:     Head: Normocephalic and atraumatic.     Nose: Nose normal.  Cardiovascular:     Rate and Rhythm: Normal rate.  Pulmonary:     Effort: Pulmonary effort is normal.  Musculoskeletal:        General: Normal range of motion.     Cervical back: Normal range of motion.  Skin:    General: Skin is dry.  Neurological:  Mental Status: He is alert and oriented to person, place, and time.  Psychiatric:        Attention and Perception: Attention and perception normal.        Mood and Affect: Mood is anxious and depressed.        Speech: Speech normal.        Behavior: Behavior normal.        Thought Content: Thought content normal.        Cognition and Memory: Cognition and memory normal.        Judgment: Judgment normal.    Review of Systems  Constitutional: Negative.   HENT: Negative.    Eyes: Negative.   Respiratory: Negative.    Cardiovascular: Negative.   Gastrointestinal: Negative.   Genitourinary: Negative.    Musculoskeletal: Negative.   Skin: Negative.   Neurological: Negative.   Endo/Heme/Allergies: Negative.   Psychiatric/Behavioral:  Positive for depression. The patient is nervous/anxious.    Blood pressure 117/66, pulse 78, temperature 98.2 F (36.8 C), temperature source Oral, resp. rate 16, height 5\' 4"  (1.626 m), weight 72.6 kg, SpO2 98%. Body mass index is 27.46 kg/m.   Medical Decision Making: Patient denies SI/HI/AVH. He is happy his blood sugar is wnl and plans to meet with Diabetic Nurse for education on DM.  Patient has resumed his home medications including Abilify.  Although he has side effect of tremor related to Abilify he says his mood is better managed with Abilify and continues to take it.    Admit and Seek bed placement. Earney Navy, NP-PMHNP-BC  01/14/2023, 6:05 PM

## 2023-01-14 NOTE — Inpatient Diabetes Management (Signed)
Inpatient Diabetes Program Recommendations  AACE/ADA: New Consensus Statement on Inpatient Glycemic Control (2015)  Target Ranges:  Prepandial:   less than 140 mg/dL      Peak postprandial:   less than 180 mg/dL (1-2 hours)      Critically ill patients:  140 - 180 mg/dL   Lab Results  Component Value Date   GLUCAP 305 (H) 01/14/2023   HGBA1C 11.2 (H) 11/27/2021    Review of Glycemic Control  Latest Reference Range & Units 01/13/23 16:37 01/13/23 17:12 01/13/23 22:33 01/14/23 06:26 01/14/23 09:12 01/14/23 12:26  Glucose-Capillary 70 - 99 mg/dL 161 (H) 096 (H) 045 (H) 286 (H) 251 (H) 305 (H)  (H): Data is abnormally high  Diabetes history: T1DM  Outpatient Diabetes medications:  [facility] endo with [facility]  Omnipod Dash with Dexcom  basal rate from 1.8 units/hr to 2.1 units/hr. Basal total 50.4 units/day. 8 units with meals.  Blood glucose target 120 mg/dL Trulicity 1.5 mg weekly  If not on insulin pump, administer Basaglar 40 units daily. Give Novolog 8 units with meals.   Current orders for Inpatient glycemic control: Semglee 40 units every day, Novolog 0-9 units TID and 0-5 units QHS  Inpatient Diabetes Program Recommendations:    Semglee 50 units QHS Novolog 3 units TID with meals if he consumes at least 50%  Will continue to follow while inpatient.  Thank you, Dulce Sellar, MSN, CDCES Diabetes Coordinator Inpatient Diabetes Program [phone] (team pager from 8a-5p)

## 2023-01-14 NOTE — ED Provider Notes (Signed)
Emergency Medicine Observation Re-evaluation Note  Danny Werner is a 27 y.o. male, seen on rounds today.  Pt initially presented to the ED for complaints of Hyperglycemia Currently, the patient is sitting in bed and has no complaints.  Physical Exam  BP 117/66 (BP Location: Right Arm)   Pulse 78   Temp 98.2 F (36.8 C) (Oral)   Resp 16   Ht 5\' 4"  (1.626 m)   Wt 72.6 kg   SpO2 98%   BMI 27.46 kg/m  Physical Exam General: No acute distress Cardiac: Regular rate Lungs: Clear Psych: Cooperative, SI ED Course / MDM  EKG:   I have reviewed the labs performed to date as well as medications administered while in observation.  Recent changes in the last 24 hours include blood sugars are stable in the 200s.  No other issues..  Plan  Current plan is for inpatient.    Gwyneth Sprout, MD 01/14/23 1159

## 2023-01-14 NOTE — ED Notes (Signed)
Patient to room 39. Patient ambulated to room .  Patient calm and cooperative.  Patient oriented to  unit and room.

## 2023-01-15 DIAGNOSIS — F332 Major depressive disorder, recurrent severe without psychotic features: Secondary | ICD-10-CM

## 2023-01-15 LAB — BASIC METABOLIC PANEL
Anion gap: 11 (ref 5–15)
BUN: 21 mg/dL — ABNORMAL HIGH (ref 6–20)
CO2: 28 mmol/L (ref 22–32)
Calcium: 10 mg/dL (ref 8.9–10.3)
Chloride: 97 mmol/L — ABNORMAL LOW (ref 98–111)
Creatinine, Ser: 0.93 mg/dL (ref 0.61–1.24)
GFR, Estimated: >60 mL/min (ref >60)
Glucose, Bld: 339 mg/dL — ABNORMAL HIGH (ref 70–99)
Potassium: 4.1 mmol/L (ref 3.5–5.1)
Sodium: 136 mmol/L (ref 135–145)

## 2023-01-15 LAB — CBG MONITORING, ED
Glucose-Capillary: 302 mg/dL — ABNORMAL HIGH (ref 70–99)
Glucose-Capillary: 261 mg/dL — ABNORMAL HIGH (ref 70–99)

## 2023-01-15 MED ORDER — INSULIN GLARGINE-YFGN 100 UNIT/ML ~~LOC~~ SOLN
10.0000 [IU] | Freq: Once | SUBCUTANEOUS | Status: AC
  Administered 2023-01-15: 10 [IU] via SUBCUTANEOUS
  Filled 2023-01-15: qty 0.1

## 2023-01-15 MED ORDER — INSULIN ASPART 100 UNIT/ML IJ SOLN
3.0000 [IU] | Freq: Three times a day (TID) | INTRAMUSCULAR | Status: DC
  Administered 2023-01-15: 3 [IU] via SUBCUTANEOUS
  Filled 2023-01-15: qty 0.03

## 2023-01-15 MED ORDER — INSULIN GLARGINE-YFGN 100 UNIT/ML ~~LOC~~ SOLN
50.0000 [IU] | Freq: Every day | SUBCUTANEOUS | Status: DC
  Filled 2023-01-15: qty 0.5

## 2023-01-15 NOTE — Progress Notes (Signed)
This CSW requested [facility] to review for inpatient BH placement within [facility] Pt meets inpatient BH placement Earney Navy, NP-PMHNP-BC.   Maryjean Ka, MSW, LCSWA 01/15/2023 1:51 AM

## 2023-01-15 NOTE — Consult Note (Signed)
[facility] Initial Consultation Note  Danny Werner:[id number] DOB: Dec 25, 1995 DOA: 01/13/2023  PCP: [facility]   Requesting Physician: Dr. Freida Busman, emergency department  Reason for Consultation: Medical Management of diabetes  HPI: Danny Werner is a 27 y.o. male with medical history significant for bipolar disorder, insulin-dependent type 2 diabetes, intermittent explosive disorder and depression who was admitted to the ER for voluntary psychiatric hold due to suicidal ideation and hyperglycemia.  He presented to the emergency department the afternoon of 9/29 with complaints of hyperglycemia and suicidal ideation.  He has a strong family history of suicide.  He was seen by inpatient psychiatric services, who agreed with psychiatric admission and he has been awaiting placement.  In the meantime he was also noted to be hyperglycemic initial blood glucose was as high as 431.  He had some mild associated hyponatremia but otherwise labs were unremarkable.  He is followed by endocrine at [facility], he is on an insulin pump.  History provided by the patient as well as his mother who is at the bedside, she says that he is not consistently compliant with his insulin pump as the needle placement causes pain.  His insulin pump has been deactivated since he has been here in the emergency department, blood sugars have slowly been improving, he has been on Semglee 40 units at night, with sliding scale.  Denies any abdominal pain, nausea or vomiting.  This morning the patient has been psychiatrically cleared and is planning to follow-up with his outpatient psychiatrist.  Review of Systems: Please see HPI for pertinent positives and negatives. A complete 10 system review of systems are otherwise negative.  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    Depression    Depression    Diabetes mellitus without complication (HCC)    Diabetes mellitus, type II (HCC)    Intermittent explosive disorder    Migraines    Trauma    Tremor    Tumor    left shoulfer   Past Surgical History:  Procedure Laterality Date   ADENOIDECTOMY     ANAL RECTAL MANOMETRY N/A 01/18/2016   Procedure: ANO RECTAL MANOMETRY;  Surgeon: Willis Modena, MD;  Location: [facility];  Service: Endoscopy;  Laterality: N/A;   FRACTURE SURGERY     MOUTH SURGERY     TONSILLECTOMY     TUMOR REMOVAL Left    shoulder-removed age 60    Social History:  reports that he has been smoking e-cigarettes. He has never used smokeless tobacco. He reports that he does not drink alcohol and does not use drugs.  Allergies  Allergen Reactions   Bee Venom Anaphylaxis and Swelling   Codeine Other (See Comments)    Seizures    Penicillins Other (See Comments)    Seizures  Has patient had a PCN reaction causing immediate rash, facial/tongue/throat swelling, SOB or lightheadedness with hypotension: No Has patient had a PCN reaction causing severe rash involving mucus membranes or skin necrosis: No Has patient had a PCN reaction that required hospitalization: Yes Has patient had a PCN reaction occurring within the last 10 years: No If all of the above answers are "NO", then may proceed with Cephalosporin use.    Promethazine Hcl Other (See Comments)    Seizures    Family History  Problem Relation Age  of Onset   Asthma Mother    Depression Father    Bipolar disorder Father    Depression Paternal Aunt    Depression Maternal Uncle    Depression Paternal Grandfather    Dementia Paternal Grandmother    Depression Paternal Grandmother    Depression Cousin    Diabetes Neg Hx      Prior to Admission medications   Medication Sig Start Date End Date Taking? Authorizing Provider  acetaminophen (TYLENOL) 500 MG tablet Take 500-1,000 mg by mouth every 6 (six) hours as needed for  mild pain or headache.   Yes [provider]  ARIPiprazole (ABILIFY) 20 MG tablet Take 20 mg by mouth at bedtime.   Yes [provider]  benztropine (COGENTIN) 0.5 MG tablet Take 0.5 mg by mouth daily. 12/26/22  Yes [provider]  Continuous Blood Gluc Sensor (DEXCOM G7 SENSOR) MISC Inject 1 Device into the skin See admin instructions. Place 1 device into the skin every 10 days   Yes [provider]  guanFACINE (INTUNIV) 4 MG TB24 ER tablet Take 4 mg by mouth at bedtime. 03/28/21  Yes [provider]  hydrOXYzine (VISTARIL) 25 MG capsule Take 25 mg by mouth in the morning and at bedtime. 09/07/21  Yes [provider]  Insulin Disposable Pump (OMNIPOD DASH PODS, GEN 4,) MISC Inject into the skin.   Yes [provider]  insulin lispro (HUMALOG) 100 UNIT/ML injection Inject into the skin See admin instructions. For use with insulin pump and bolus 15 units after each meal OR AS OTHERWISE INSTRUCTED 04/27/21  Yes [provider]  Melatonin 10 MG CAPS Take 1 capsule by mouth at bedtime. 03/19/22  Yes [provider]  omeprazole (PRILOSEC) 40 MG capsule Take 40 mg by mouth daily.   Yes [provider]  propranolol ER (INDERAL LA) 120 MG 24 hr capsule Take 120 mg by mouth in the morning and at bedtime.   Yes [provider]  ramipril (ALTACE) 5 MG capsule Take 5 mg by mouth daily. 12/25/22  Yes [provider]  traZODone (DESYREL) 50 MG tablet Take 50 mg by mouth at bedtime. 09/21/22  Yes [provider]  VRAYLAR 1.5 MG capsule Take 1.5 mg by mouth daily. 12/25/22  Yes [provider]  EPINEPHrine 0.3 mg/0.3 mL IJ SOAJ injection Inject 0.3 mg into the skin once as needed for anaphylaxis. Inject into the middle of the outer thigh and hold for 3 seconds as needed for severe allergic reaction then call 911 if used. Patient not taking: Reported on 01/13/2023 10/18/20   [provider]     Physical Exam: BP 101/63 (BP Location: Left Arm)   Pulse 73   Temp 97.9 F (36.6 C) (Oral)   Resp 16   Ht 5\' 4"  (1.626 m)   Wt 72.6 kg   SpO2 99%   BMI 27.46 kg/m   General:  Alert, oriented, calm, in no acute distress, seen ambulating in the hall with his mother, he is pleasant and cooperative Cardiovascular: RRR, no murmurs or rubs, no peripheral edema  Respiratory: clear to auscultation bilaterally, no wheezes, no crackles  Abdomen: soft, nontender, nondistended, normal bowel tones heard  Skin: dry, no rashes  Musculoskeletal: no joint effusions, normal range of motion  Psychiatric: appropriate affect, normal speech  Neurologic: extraocular muscles intact, clear speech, moving all extremities with intact sensorium, ambulating with normal gait         Recent Labs and Imaging Reviewed:  Basic Metabolic Panel: Recent Labs  Lab 01/13/23 1344 01/13/23 1442  NA 132* 130*  K 3.8 3.6  CL 98 97*  CO2  --  23  GLUCOSE 431* 343*  BUN 17 16  CREATININE 0.70 0.57*  CALCIUM  --  9.0   Liver Function Tests: Recent Labs  Lab 01/13/23 1442  AST 20  ALT 25  ALKPHOS 104  BILITOT 1.2  PROT 7.1  ALBUMIN 4.1   Recent Labs  Lab 01/13/23 1442  LIPASE 28   No results for input(s): "AMMONIA" in the last 168 hours. CBC: Recent Labs  Lab 01/13/23 1331 01/13/23 1344  WBC 6.5  --   HGB 15.9 16.3  HCT 44.5 48.0  MCV 79.5*  --   PLT 310  --    Cardiac Enzymes: No results for input(s): "CKTOTAL", "CKMB", "CKMBINDEX", "TROPONINI" in the last 168 hours.  BNP (last 3 results) No results for input(s): "BNP" in the last 8760 hours.  ProBNP (last 3 results) No results for input(s): "PROBNP" in the last 8760 hours.  CBG: Recent Labs  Lab 01/14/23 0912 01/14/23 1226 01/14/23 1705 01/14/23 2151 01/15/23 0801  GLUCAP 251* 305* 259* 361* 302*    Radiological Exams on Admission: No results found.  Summary and Recommendations: AMMON MUSCATELLO is a 26 y.o. male with  medical history significant for bipolar disorder, insulin-dependent type 2 diabetes, intermittent explosive disorder and depression who was admitted to the ER for voluntary psychiatric hold due to suicidal ideation and hyperglycemia.  Type 2 diabetes with hyperglycemia-seems to be not well-controlled, patient states his last hemoglobin A1c was greater than 8.  He has intermittent compliance with his insulin pump. -Carb controlled diet -Increase basal insulin to 50 units Semglee (will give 10 units now) -NovoLog sliding scale with 3 units 3 times daily with meals -Check repeat BMP now, to rule out evidence of acidosis (not highly suspected) -I recommended to the patient and his mother that he remain on basal bolus insulin, and off of his insulin pump, until he follows up with his outpatient endocrinologist at [facility] clinic -If BMP is nonconcerning, and blood sugars continue to improve, there is no medical barrier to hospital discharge  Suicidal ideation-this has thankfully resolved, the patient is psychiatrically stable and has been cleared for discharge by psychiatric services.  He will follow-up with his outpatient psychiatric provider, has been given follow-up instructions.  Thank you for involving [place] in the care of your patient. [facility] will continue to follow along with you and available for questions until discharge.  Time spent: 55 minutes  Malee Grays Sharlette Dense MD [facility] Pager [phone]  If 7PM-7AM, please contact night-coverage [email or web address] Password [email or web address]  01/15/2023, 10:42 AM

## 2023-01-15 NOTE — Discharge Summary (Signed)
[facility] Psych ED Discharge  01/15/2023 10:19 AM Danny Werner  MRN:  [id number]  Principal Problem: Major depressive disorder, recurrent severe without psychotic features [facility]) Discharge Diagnoses: Principal Problem:   Major depressive disorder, recurrent severe without psychotic features (HCC) Active Problems:   Current moderate episode of major depressive disorder (HCC)   Suicide ideation  Clinical Impression:  Final diagnoses:  None   Subjective:   Danny Werner is a 27 y.o. male patient admitted with previous hx of Depression, Bipolar disorder, anxiety and PTSD brought in by EMS for hyper Glycemia and also feeling suicidal. \  Patient is seen in the dayroom interacting with  a peer and watching TV.  He presented a pleasant affect and reports better mood since his blood sugar is stable and wnl.  Patient states he is going to engage in DM education and diet.  He is also on Abilify and Vraylar to mange depression and Bipolar mood disorder.  He is compliant with his medications in the unit here.  Over all patient reports improved sleep and mood.  He denies SI/HI/AVH and no mention of Paranoia.  Patient states that he knows when to go to the ER or call 911 for Mental health issues including but not limited to suicide ideation.  He has strong family support-grandparents and his mother.  He is unemployed at this time as well but plans to look for something less stressful to do.  Patient see a Therapist, sports at [facility] [place].  We verified that he has an upcoming appointment on the 11th of October and he plans to keep that appointment. Caucasian male, 27 years old came in on Saturday with symptoms of elevated sugar and at the time was feeling suicidal.  He reports improved mood, blood sugar is controlled and he vehemently denies feeling suicidal.  Patient will keep his Psychiatry appointment on the 11 th of October.  Patient is Psychiatrically cleared. We reviewed safety plan-call  911 or 988 for Mental health Crisis including but not limited to Suicide attempt or ideations.  Go to the [facility] ER for blood sugar issues and seek Mental healthcare at [facility] facility.  DT Lucianne Muss is in agreement for this discharge as patient is stable and has outpatient appointment scheduled.  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   Depression    Depression    Diabetes mellitus without complication (HCC)    Diabetes mellitus, type II (HCC)    Intermittent explosive disorder    Migraines    Trauma    Tremor    Tumor    left shoulfer    Past Surgical History:  Procedure Laterality Date   ADENOIDECTOMY     ANAL RECTAL MANOMETRY N/A 01/18/2016   Procedure: ANO RECTAL MANOMETRY;  Surgeon: Willis Modena, MD;  Location: [facility];  Service: Endoscopy;  Laterality: N/A;   FRACTURE SURGERY     MOUTH SURGERY     TONSILLECTOMY     TUMOR REMOVAL Left    shoulder-removed age 61   Family History:  Family History  Problem Relation Age of Onset   Asthma Mother  Depression Father    Bipolar disorder Father    Depression Paternal Aunt    Depression Maternal Uncle    Depression Paternal Grandfather    Dementia Paternal Grandmother    Depression Paternal Grandmother    Depression Cousin    Diabetes Neg Hx    Family Psychiatric  History: see initial Psychiatry evaluation note Social History:  Social History   Substance and Sexual Activity  Alcohol Use No     Social History   Substance and Sexual Activity  Drug Use No    Social History   Socioeconomic History   Marital status: Single    Spouse name: Not on file    Number of children: 0   Years of education: Not on file   Highest education level: High school graduate  Occupational History   Not on file  Tobacco Use   Smoking status: Every Day    Types: E-cigarettes   Smokeless tobacco: Never  Vaping Use   Vaping status: Every Day  Substance and Sexual Activity   Alcohol use: No   Drug use: No   Sexual activity: Not on file  Other Topics Concern   Not on file  Social History Narrative   Lives with Grandparents   Left handed    Social Determinants of Health   Financial Resource Strain: Not on file  Food Insecurity: Not on file  Transportation Needs: Not on file  Physical Activity: Not on file  Stress: Not on file  Social Connections: Not on file    Tobacco Cessation:  N/A, patient does not currently use tobacco products  Current Medications: Current Facility-Administered Medications  Medication Dose Route Frequency Provider Last Rate Last Admin   acetaminophen (TYLENOL) tablet 500-1,000 mg  500-1,000 mg Oral Q6H PRN Wynetta Fines, MD       ARIPiprazole (ABILIFY) tablet 20 mg  20 mg Oral QHS Wynetta Fines, MD   20 mg at 01/14/23 2146   benztropine (COGENTIN) tablet 0.5 mg  0.5 mg Oral Daily Plunkett, Whitney, MD   0.5 mg at 01/15/23 0920   cariprazine (VRAYLAR) capsule 1.5 mg  1.5 mg Oral Daily Plunkett, Whitney, MD   1.5 mg at 01/15/23 0921   escitalopram (LEXAPRO) tablet 5 mg  5 mg Oral Daily Joseluis Alessio C, NP   5 mg at 01/15/23 0919   guanFACINE (INTUNIV) ER tablet 4 mg  4 mg Oral Daily Gwyneth Sprout, MD   4 mg at 01/15/23 4782   hydrOXYzine (ATARAX) tablet 25 mg  25 mg Oral BID Herby Abraham, RPH   25 mg at 01/15/23 0920   insulin aspart (novoLOG) injection 0-5 Units  0-5 Units Subcutaneous QHS Wynetta Fines, MD   5 Units at 01/14/23 2205   insulin aspart (novoLOG) injection 0-9 Units  0-9 Units Subcutaneous TID WC Wynetta Fines, MD   7 Units at 01/15/23 0921   insulin glargine-yfgn (SEMGLEE) injection 40  Units  40 Units Subcutaneous Daily Wynetta Fines, MD   40 Units at 01/14/23 2222   melatonin tablet 5 mg  5 mg Oral QHS Cherese Lozano C, NP   5 mg at 01/14/23 2146   nicotine (NICODERM CQ - dosed in mg/24 hours) patch 21 mg  21 mg Transdermal Once Durwin Glaze, MD       pantoprazole (PROTONIX) EC tablet 40 mg  40 mg Oral Daily Wynetta Fines, MD   40 mg at 01/15/23 0920   propranolol (INDERAL) tablet 40  mg  40 mg Oral BID Wynetta Fines, MD   40 mg at 01/15/23 0919   ramipril (ALTACE) capsule 5 mg  5 mg Oral Daily Gwyneth Sprout, MD   5 mg at 01/15/23 1610   traZODone (DESYREL) tablet 50 mg  50 mg Oral QHS Gwyneth Sprout, MD   50 mg at 01/14/23 2145   Current Outpatient Medications  Medication Sig Dispense Refill   acetaminophen (TYLENOL) 500 MG tablet Take 500-1,000 mg by mouth every 6 (six) hours as needed for mild pain or headache.     ARIPiprazole (ABILIFY) 20 MG tablet Take 20 mg by mouth at bedtime.     benztropine (COGENTIN) 0.5 MG tablet Take 0.5 mg by mouth daily.     Continuous Blood Gluc Sensor (DEXCOM G7 SENSOR) MISC Inject 1 Device into the skin See admin instructions. Place 1 device into the skin every 10 days     guanFACINE (INTUNIV) 4 MG TB24 ER tablet Take 4 mg by mouth at bedtime.     hydrOXYzine (VISTARIL) 25 MG capsule Take 25 mg by mouth in the morning and at bedtime.     Insulin Disposable Pump (OMNIPOD DASH PODS, GEN 4,) MISC Inject into the skin.     insulin lispro (HUMALOG) 100 UNIT/ML injection Inject into the skin See admin instructions. For use with insulin pump and bolus 15 units after each meal OR AS OTHERWISE INSTRUCTED     Melatonin 10 MG CAPS Take 1 capsule by mouth at bedtime.     omeprazole (PRILOSEC) 40 MG capsule Take 40 mg by mouth daily.     propranolol ER (INDERAL LA) 120 MG 24 hr capsule Take 120 mg by mouth in the morning and at bedtime.     ramipril (ALTACE) 5 MG capsule Take 5 mg by mouth daily.     traZODone (DESYREL) 50 MG tablet  Take 50 mg by mouth at bedtime.     VRAYLAR 1.5 MG capsule Take 1.5 mg by mouth daily.     EPINEPHrine 0.3 mg/0.3 mL IJ SOAJ injection Inject 0.3 mg into the skin once as needed for anaphylaxis. Inject into the middle of the outer thigh and hold for 3 seconds as needed for severe allergic reaction then call 911 if used. (Patient not taking: Reported on 01/13/2023)     PTA Medications: (Not in a hospital admission)   [place] Scale:  Flowsheet Row ED from 01/13/2023 in [facility] Department at [facility] ED from 10/04/2022 in [facility] Emergency Department at [facility] ED from 04/01/2022 in [facility] Emergency Department at [facility]  C-SSRS RISK CATEGORY High Risk No Risk No Risk       Musculoskeletal: Strength & Muscle Tone: within normal limits Gait & Station: normal Patient leans: Front  Psychiatric Specialty Exam: Presentation  General Appearance:  Casual; Neat  Eye Contact: Good  Speech: Clear and Coherent; Normal Rate  Speech Volume: Normal  Handedness: Right   Mood and Affect  Mood: Depressed  Affect: Congruent   Thought Process  Thought Processes: Coherent; Goal Directed  Descriptions of Associations:Intact  Orientation:Full (Time, Place and Person)  Thought Content:Logical  History of Schizophrenia/Schizoaffective disorder:No data recorded Duration of Psychotic Symptoms:No data recorded Hallucinations:Hallucinations: None  Ideas of Reference:None  Suicidal Thoughts:Suicidal Thoughts: No  Homicidal Thoughts:Homicidal Thoughts: No   Sensorium  Memory: Immediate Good; Recent Good; Remote Good  Judgment: Intact  Insight: Good   Executive Functions  Concentration: Good  Attention Span: Good  Recall: Dudley Major of Knowledge: Good  Language: Good   Psychomotor Activity  Psychomotor Activity: Psychomotor Activity: Normal   Assets  Assets: Communication Skills; Desire for  Improvement; Housing; Social Support   Sleep  Sleep: Sleep: Good    Physical Exam: Physical Exam Vitals and nursing note reviewed.  Constitutional:      Appearance: Normal appearance.  HENT:     Head: Normocephalic and atraumatic.     Nose: Nose normal.  Cardiovascular:     Rate and Rhythm: Normal rate and regular rhythm.  Pulmonary:     Effort: Pulmonary effort is normal.  Musculoskeletal:        General: Normal range of motion.     Cervical back: Normal range of motion.  Skin:    General: Skin is dry.  Neurological:     Mental Status: He is alert and oriented to person, place, and time.  Psychiatric:        Attention and Perception: Attention and perception normal.        Mood and Affect: Mood normal.        Speech: Speech normal.        Behavior: Behavior normal. Behavior is cooperative.        Thought Content: Thought content normal.        Cognition and Memory: Cognition and memory normal.        Judgment: Judgment normal.    Review of Systems  Constitutional: Negative.   HENT: Negative.    Eyes: Negative.   Respiratory: Negative.    Cardiovascular: Negative.   Gastrointestinal: Negative.   Genitourinary: Negative.   Musculoskeletal: Negative.   Skin: Negative.   Neurological: Negative.   Endo/Heme/Allergies: Negative.   Psychiatric/Behavioral: Negative.     Blood pressure 101/63, pulse 73, temperature 97.9 F (36.6 C), temperature source Oral, resp. rate 16, height 5\' 4"  (1.626 m), weight 72.6 kg, SpO2 99%. Body mass index is 27.46 kg/m.   Demographic Factors:  Male, Adolescent or young adult, Caucasian, Low socioeconomic status, and Unemployed  Loss Factors: NA  Historical Factors: Family history of mental illness or substance abuse  Risk Reduction Factors:   Living with another person, especially a relative, Positive social support, and Positive coping skills or problem solving skills  Continued Clinical Symptoms:  Depression:    Insomnia Previous Psychiatric Diagnoses and Treatments  Cognitive Features That Contribute To Risk:  None    Suicide Risk:  Minimal: No identifiable suicidal ideation.  Patients presenting with no risk factors but with morbid ruminations; may be classified as minimal risk based on the severity of the depressive symptoms    Plan Of Care/Follow-up recommendations:  Activity:  as tolerated Diet:  Low Carb, diabetic   Medical Decision Making: Patient after review today has much improved.  He denies SI/HI/AVH.   Patient has been compliant with Medications.  Patient has appointment with his Psychiatrist next week.  He has strong family support and is happy in the home.  Patient is Psychiatrically cleared.  Disposition: Psychiatrically cleared. Earney Navy, NP-PMHNP-BC 01/15/2023, 10:19 AM

## 2023-01-15 NOTE — Inpatient Diabetes Management (Signed)
Inpatient Diabetes Program Recommendations  AACE/ADA: New Consensus Statement on Inpatient Glycemic Control (2015)  Target Ranges:  Prepandial:   less than 140 mg/dL      Peak postprandial:   less than 180 mg/dL (1-2 hours)      Critically ill patients:  140 - 180 mg/dL   Lab Results  Component Value Date   GLUCAP 302 (H) 01/15/2023   HGBA1C 11.2 (H) 11/27/2021    Review of Glycemic Control  Latest Reference Range & Units 01/14/23 09:12 01/14/23 12:26 01/14/23 17:05 01/14/23 21:51 01/15/23 08:01  Glucose-Capillary 70 - 99 mg/dL 657 (H) 846 (H) 962 (H) 361 (H) 302 (H)  (H): Data is abnormally high  Outpatient Diabetes medications:  Jamie Huffman endo with [facility]  Omnipod Dash with Dexcom  basal rate from 1.8 units/hr to 2.1 units/hr. Basal total 50.4 units/day. 8 units with meals.  Blood glucose target 120 mg/dL Trulicity 1.5 mg weekly  If not on insulin pump, administer Basaglar 40 units daily. Give Novolog 8 units with meals.    Current orders for Inpatient glycemic control: Semglee 40 units every day, Novolog 0-9 units TID and 0-5 units QHS   Inpatient Diabetes Program Recommendations:     Semglee 50 units QHS Novolog 3 units TID with meals if he consumes at least 50%  Will continue to follow while inpatient.  Thank you, Dulce Sellar, MSN, CDCES Diabetes Coordinator Inpatient Diabetes Program [phone] (team pager from 8a-5p)

## 2023-01-15 NOTE — ED Notes (Signed)
Patient off unit to home per provider.  Patient calm, cooperative, no s/s of distress at this times. Discharge information given to patient. Belongings given to patient. Patient ambulatory off unit, escorted by NT. Patient transported by family member.

## 2023-01-15 NOTE — ED Notes (Signed)
Patient has been alert and oriented. Patient calm and cooperative.  Patient  denied suicidal  ideation. Patient denied homicidal ideation.  No delusions noted. No paranoia noted.

## 2023-01-15 NOTE — ED Provider Notes (Addendum)
Emergency Medicine Observation Re-evaluation Note  Danny Werner is a 27 y.o. male, seen on rounds today.  Pt initially presented to the ED for complaints of Hyperglycemia Currently, the patient is resting comfortably..  Physical Exam  BP 101/63 (BP Location: Left Arm)   Pulse 73   Temp 97.9 F (36.6 C) (Oral)   Resp 16   Ht 1.626 m (5\' 4" )   Wt 72.6 kg   SpO2 99%   BMI 27.46 kg/m  Physical Exam   ED Course / MDM  EKG:   I have reviewed the labs performed to date as well as medications administered while in observation.  Recent changes in the last 24 hours include blood sugars have been difficult to manage..  Plan  Current plan is for internal medicine hospitalist team to come and see patient and aid in blood sugar management.  Still require psychiatric hospitalization.   1:26 PM Patient seen by psychiatry and cleared.  Blood sugar management input given by hospitalist team.  Will discharge    Lorre Nick, MD 01/15/23 [id number]    Lorre Nick, MD 01/15/23 1327

## 2023-02-27 ENCOUNTER — Encounter: Payer: Managed Care, Other (non HMO) | Attending: Internal Medicine | Admitting: Nutrition

## 2023-02-27 DIAGNOSIS — Z713 Dietary counseling and surveillance: Secondary | ICD-10-CM | POA: Diagnosis not present

## 2023-02-27 DIAGNOSIS — Z7984 Long term (current) use of oral hypoglycemic drugs: Secondary | ICD-10-CM | POA: Insufficient documentation

## 2023-02-27 DIAGNOSIS — Z794 Long term (current) use of insulin: Secondary | ICD-10-CM | POA: Diagnosis not present

## 2023-02-27 DIAGNOSIS — E1065 Type 1 diabetes mellitus with hyperglycemia: Secondary | ICD-10-CM | POA: Diagnosis present

## 2023-03-05 ENCOUNTER — Telehealth: Payer: Self-pay | Admitting: Nutrition

## 2023-03-05 NOTE — Patient Instructions (Addendum)
TAke Semglee every night Take Novolog before all meals Consider Beta Bionic insulin pump

## 2023-03-05 NOTE — Telephone Encounter (Signed)
Phone went straight to voice mail.  LVM to call me to let me know how his blood sugars are doing, if he is taking his insulin before eating as well at at bedtime, and if he has gotten the OmniPOd 5 pump.  Gave number to call me back.

## 2023-03-05 NOTE — Progress Notes (Signed)
Patient is here today because he says his blood sugars are going high, and wants more information on eating.  Was on Dash system, but is not on this now. History:  frequent ketoacidosis hospital visits.  Patient says he does not know why this is.   Diabetes medications: Semglee 35u q HS.  Says has been taking this nightly                                     Novolog 4-10u ac based on current blood sugar. SGBM:  Dexcom G7.  Pt. Not wearing one at this time. He was given one to use.   Discussion:  Need to take the Semglee nightly.  This is 1/2 of your daily dose of insulin.  Very important to bring down morning blood sugars to start the day with a lower readings. Need for Novolog 10 minutes before meals.   Says he liked his Dash pump.  Discussed how the new OmniPod 5 works to give extra insulin when the blood sugar goes high and stops the flow when it goes low.  Discussed importance of have a pod on and how, in 4 hours without insulin, his blood sugars will begin to go very high and result in nausea and ketoacidosis.   Discussed diet.  Pt. Very vague about what he is eating, so general discussion about the need for protein, some carbs and small amount of fats are needed in each meal.  Discussed what foods fall into each of these three groups and how much he should be eating.   Believer overall understanding is good but motivation is lacking for needed medication and pump usage. I believe if he goes back on a pump, he would do better on the Beta bionic pump, where he get better control without the need to even bolus.  Did not discuss this with the patient.

## 2023-07-28 ENCOUNTER — Encounter (HOSPITAL_COMMUNITY): Payer: Self-pay | Admitting: Emergency Medicine

## 2023-07-28 ENCOUNTER — Emergency Department (HOSPITAL_COMMUNITY)

## 2023-07-28 ENCOUNTER — Other Ambulatory Visit: Payer: Self-pay

## 2023-07-28 ENCOUNTER — Emergency Department (HOSPITAL_COMMUNITY)
Admission: EM | Admit: 2023-07-28 | Discharge: 2023-07-28 | Disposition: A | Discharge status: Home / Self Care | Attending: Emergency Medicine | Admitting: Emergency Medicine

## 2023-07-28 DIAGNOSIS — Z9641 Presence of insulin pump (external) (internal): Secondary | ICD-10-CM | POA: Diagnosis not present

## 2023-07-28 DIAGNOSIS — R739 Hyperglycemia, unspecified: Secondary | ICD-10-CM

## 2023-07-28 DIAGNOSIS — S6991XA Unspecified injury of right wrist, hand and finger(s), initial encounter: Secondary | ICD-10-CM | POA: Diagnosis present

## 2023-07-28 DIAGNOSIS — S60410A Abrasion of right index finger, initial encounter: Secondary | ICD-10-CM | POA: Insufficient documentation

## 2023-07-28 DIAGNOSIS — W228XXA Striking against or struck by other objects, initial encounter: Secondary | ICD-10-CM | POA: Insufficient documentation

## 2023-07-28 DIAGNOSIS — E1165 Type 2 diabetes mellitus with hyperglycemia: Secondary | ICD-10-CM | POA: Diagnosis not present

## 2023-07-28 DIAGNOSIS — M79644 Pain in right finger(s): Secondary | ICD-10-CM | POA: Insufficient documentation

## 2023-07-28 LAB — URINALYSIS, ROUTINE W REFLEX MICROSCOPIC
Bacteria, UA: NONE SEEN
Bilirubin Urine: NEGATIVE
Glucose, UA: >=500 mg/dL — AB
Hgb urine dipstick: NEGATIVE
Ketones, ur: 20 mg/dL — AB
Leukocytes,Ua: NEGATIVE
Nitrite: NEGATIVE
Protein, ur: NEGATIVE mg/dL
Specific Gravity, Urine: 1.023 (ref 1.005–1.030)
pH: 6 (ref 5.0–8.0)

## 2023-07-28 LAB — COMPREHENSIVE METABOLIC PANEL WITH GFR
ALT: 20 U/L (ref 0–44)
AST: 17 U/L (ref 15–41)
Albumin: 4.6 g/dL (ref 3.5–5.0)
Alkaline Phosphatase: 104 U/L (ref 38–126)
Anion gap: 12 (ref 5–15)
BUN: 12 mg/dL (ref 6–20)
CO2: 25 mmol/L (ref 22–32)
Calcium: 9.9 mg/dL (ref 8.9–10.3)
Chloride: 98 mmol/L (ref 98–111)
Creatinine, Ser: 0.86 mg/dL (ref 0.61–1.24)
GFR, Estimated: >60 mL/min (ref >60)
Glucose, Bld: 336 mg/dL — ABNORMAL HIGH (ref 70–99)
Potassium: 3.5 mmol/L (ref 3.5–5.1)
Sodium: 135 mmol/L (ref 135–145)
Total Bilirubin: 2.1 mg/dL — ABNORMAL HIGH (ref 0.0–1.2)
Total Protein: 8.4 g/dL — ABNORMAL HIGH (ref 6.5–8.1)

## 2023-07-28 LAB — CBC
HCT: 48 % (ref 39.0–52.0)
Hemoglobin: 17 g/dL (ref 13.0–17.0)
MCH: 28.3 pg (ref 26.0–34.0)
MCHC: 35.4 g/dL (ref 30.0–36.0)
MCV: 80 fL (ref 80.0–100.0)
Platelets: 337 10*3/uL (ref 150–400)
RBC: 6 MIL/uL — ABNORMAL HIGH (ref 4.22–5.81)
RDW: 13.1 % (ref 11.5–15.5)
WBC: 8.1 10*3/uL (ref 4.0–10.5)
nRBC: 0 % (ref 0.0–0.2)

## 2023-07-28 LAB — CBG MONITORING, ED
Glucose-Capillary: 334 mg/dL — ABNORMAL HIGH (ref 70–99)
Glucose-Capillary: 231 mg/dL — ABNORMAL HIGH (ref 70–99)
Glucose-Capillary: 171 mg/dL — ABNORMAL HIGH (ref 70–99)

## 2023-07-28 MED ORDER — INSULIN ASPART 100 UNIT/ML IJ SOLN
4.0000 [IU] | Freq: Once | INTRAMUSCULAR | Status: AC
  Administered 2023-07-28: 4 [IU] via INTRAVENOUS
  Filled 2023-07-28: qty 0.04

## 2023-07-28 MED ORDER — SODIUM CHLORIDE 0.9 % IV BOLUS
500.0000 mL | Freq: Once | INTRAVENOUS | Status: AC
  Administered 2023-07-28: 500 mL via INTRAVENOUS

## 2023-07-28 MED ORDER — INSULIN ASPART 100 UNIT/ML IJ SOLN
6.0000 [IU] | Freq: Once | INTRAMUSCULAR | Status: AC
  Administered 2023-07-28: 6 [IU] via SUBCUTANEOUS
  Filled 2023-07-28: qty 0.06

## 2023-07-28 NOTE — ED Triage Notes (Signed)
 Patient comes for elevated blood sugar, he ripped of insulin pump when upset today. Meter read HI.  334 cbg in triage Last meal was last night.  Denies n/v He did voice wishing he was dead with officers earlier, mother is at bedside.

## 2023-07-28 NOTE — ED Notes (Signed)
 Urinal left at bedside patient aware urine sample is needed

## 2023-07-28 NOTE — ED Notes (Signed)
Pt given a cup of water 

## 2023-07-28 NOTE — Discharge Instructions (Addendum)
 You have been seen and discharged from the emergency department.  Your blood sugar was treated.  Your hand x-ray showed no fracture.  Follow-up with your primary provider for further evaluation and further care. Take home medications as prescribed. If you have any worsening symptoms or further concerns for your health please return to an emergency department for further evaluation.
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
## 2023-07-28 NOTE — ED Notes (Addendum)
 Patient voiced he is unable to give urine sample, RN offered him fluids he declined and stated he didn't want to over urinate although he has not urinated all day.  RN to have bladder scan completed.

## 2023-11-26 NOTE — Progress Notes (Deleted)
 Assessment/Plan:   *** Uncontrolled diabetes mellitus  - Patient following with endocrinology nurse practitioner.  His last A1c was 12.5, which was up from 10.6 only 3 months prior.  Subjective:   Danny Werner was seen today in follow up for ***.  I have not seen the patient in over 2 years.  At that point in time, the patient had tremor that was multifactorial in nature.  The biggest issue was his anxiety, but there were some functional aspects to the tremor.  He also had some very mild Abilify induced tremor.  Patient is still on Abilify.  ***?  He is on Vraylar  Patient follows with endocrinology for his diabetes.  On June 30, his A1c was 12.5.  This was significantly higher from his April 9 hemoglobin A1c of 10.6.   PREVIOUS MEDICATIONS: {Parkinson's RX:18200}  CURRENT MEDICATIONS:  Outpatient Encounter Medications as of 11/28/2023  Medication Sig   ARIPiprazole (ABILIFY) 20 MG tablet Take 20 mg by mouth at bedtime.   atenolol (TENORMIN) 25 MG tablet Take 25 mg by mouth daily.   busPIRone (BUSPAR) 10 MG tablet Take 10 mg by mouth 3 (three) times daily.   cariprazine (VRAYLAR) 3 MG capsule Take 3 mg by mouth daily.   Continuous Blood Gluc Sensor (DEXCOM G7 SENSOR) MISC Inject 1 Device into the skin See admin instructions. Place 1 device into the skin every 10 days   EPINEPHrine 0.3 mg/0.3 mL IJ SOAJ injection Inject 0.3 mg into the skin once as needed for anaphylaxis. Inject into the middle of the outer thigh and hold for 3 seconds as needed for severe allergic reaction then call 911 if used.   guanFACINE (INTUNIV) 4 MG TB24 ER tablet Take 4 mg by mouth at bedtime.   Insulin Disposable Pump (OMNIPOD 5 DEXG7G6 PODS GEN 5) MISC 1 Pump by Does not apply route every 3 (three) days.   insulin glargine-yfgn (SEMGLEE) 100 UNIT/ML Pen Inject 40 Units into the skin daily.   insulin lispro (HUMALOG) 100 UNIT/ML injection Inject 200 Units into the skin See admin instructions. For use with  insulin pump and bolus 15 units after each meal OR AS OTHERWISE INSTRUCTED   Melatonin 10 MG CAPS Take 1 capsule by mouth at bedtime.   omeprazole (PRILOSEC) 40 MG capsule Take 40 mg by mouth daily.   pioglitazone (ACTOS) 15 MG tablet Take 15 mg by mouth daily.   ramipril (ALTACE) 5 MG capsule Take 5 mg by mouth daily.   No facility-administered encounter medications on file as of 11/28/2023.     Objective:   PHYSICAL EXAMINATION:    VITALS:  There were no vitals filed for this visit.  GEN:  The patient appears stated age and is in NAD. HEENT:  Normocephalic, atraumatic.  The mucous membranes are moist. The superficial temporal arteries are without ropiness or tenderness. CV:  RRR Lungs:  CTAB Neck/HEME:  There are no carotid bruits bilaterally.  Neurological examination:  Orientation: The patient is alert and oriented x3. Cranial nerves: There is good facial symmetry.The speech is fluent and clear. Soft palate rises symmetrically and there is no tongue deviation. Hearing is intact to conversational tone. Sensation: Sensation is intact to light touch throughout Motor: Strength is at least antigravity x4.  Movement examination: Tone: There is normal tone in the bilateral upper extremities.  The tone in the lower extremities is normal.  Abnormal movements: He has tremor in both arms and both legs, but it is somewhat distractible.  The left arm rest tremor is the least distractible.  He has some postural and intention tremor, low amplitude, but there is some entrainment associated with that.  He has mild tremor with Archimedes spirals bilaterally. Coordination:  There is no decremation with RAM's, with any form of RAMS, including alternating supination and pronation of the forearm, hand opening and closing, finger taps, heel taps and toe taps. Gait and Station: The patient has no difficulty arising out of a deep-seated chair without the use of the hands. The patient's stride length is  good.  There is no tremor in either arm with ambulation and arm swing is good bilaterally.    ***Total time spent on today's visit was *** minutes, including both face-to-face time and nonface-to-face time.  Time included that spent on review of records (prior notes available to me/labs/imaging if pertinent), discussing treatment and goals, answering patient's questions and coordinating care.  Cc:  [facility], [facility]

## 2023-11-28 ENCOUNTER — Ambulatory Visit: Admitting: Neurology

## 2023-11-29 ENCOUNTER — Ambulatory Visit: Admitting: Neurology

## 2024-02-26 ENCOUNTER — Other Ambulatory Visit (HOSPITAL_BASED_OUTPATIENT_CLINIC_OR_DEPARTMENT_OTHER): Payer: Self-pay

## 2024-02-27 ENCOUNTER — Other Ambulatory Visit (HOSPITAL_BASED_OUTPATIENT_CLINIC_OR_DEPARTMENT_OTHER): Payer: Self-pay

## 2024-02-27 ENCOUNTER — Other Ambulatory Visit (HOSPITAL_COMMUNITY): Payer: Self-pay

## 2024-02-27 MED ORDER — MELATONIN 10 MG PO TABS: 10.0000 mg | ORAL_TABLET | Freq: Every evening | ORAL | 1 refills | Status: AC | PRN

## 2024-02-27 MED ORDER — GUANFACINE HCL ER 4 MG PO TB24: 4.0000 mg | ORAL_TABLET | Freq: Every morning | ORAL | 3 refills | Status: AC

## 2024-02-27 MED ORDER — OMEPRAZOLE 40 MG PO CPDR: 40.0000 mg | DELAYED_RELEASE_CAPSULE | Freq: Every day | ORAL | 3 refills | Status: AC

## 2024-02-27 MED ORDER — EPINEPHRINE 0.3 MG/0.3ML IJ SOAJ: INTRAMUSCULAR | 3 refills | Status: AC

## 2024-02-27 MED ORDER — BUSPIRONE HCL 10 MG PO TABS: 10.0000 mg | ORAL_TABLET | Freq: Three times a day (TID) | ORAL | 2 refills | Status: AC

## 2024-02-27 MED ORDER — ATENOLOL 25 MG PO TABS: ORAL_TABLET | ORAL | 2 refills | Status: AC

## 2024-02-27 MED ORDER — INSULIN GLARGINE-YFGN 100 UNIT/ML ~~LOC~~ SOPN
40.0000 [IU] | PEN_INJECTOR | Freq: Every day | SUBCUTANEOUS | 1 refills | Status: AC
Start: 2023-06-20 — End: ?

## 2024-02-27 MED ORDER — DEXCOM G7 SENSOR MISC
11 refills | Status: AC
  Filled 2024-03-05: qty 3, 30d supply, fill #0
  Filled 2024-03-30: qty 3, 30d supply, fill #1
  Filled 2024-05-15 (×2): qty 1, 10d supply, fill #2

## 2024-02-27 MED ORDER — INSULIN LISPRO (1 UNIT DIAL) 100 UNIT/ML (KWIKPEN)
50.0000 [IU] | PEN_INJECTOR | Freq: Every day | SUBCUTANEOUS | 3 refills | Status: AC
Start: 2023-07-24 — End: ?

## 2024-02-27 MED ORDER — INSULIN LISPRO 100 UNIT/ML IJ SOLN
100.0000 [IU] | Freq: Every day | INTRAMUSCULAR | 5 refills | Status: AC
  Filled 2024-03-30: qty 10, 10d supply, fill #0

## 2024-02-27 MED ORDER — OMNIPOD 5 DEXG7G6 PODS GEN 5 MISC: 1.0000 | 3 refills | Status: AC

## 2024-02-27 MED ORDER — ARISTADA 882 MG/3.2ML IM PRSY
275.6000 mg | PREFILLED_SYRINGE | INTRAMUSCULAR | 3 refills | Status: DC
  Filled 2024-03-04: qty 3.2, 28d supply, fill #0

## 2024-02-27 MED ORDER — PIOGLITAZONE HCL 15 MG PO TABS
15.0000 mg | ORAL_TABLET | Freq: Every day | ORAL | 5 refills | Status: AC
  Filled 2024-03-03: qty 30, 30d supply, fill #0
  Filled 2024-03-26: qty 30, 30d supply, fill #1

## 2024-02-27 MED ORDER — RAMIPRIL 5 MG PO CAPS: 5.0000 mg | ORAL_CAPSULE | Freq: Every day | ORAL | 0 refills | Status: AC

## 2024-02-27 MED ORDER — RYBELSUS 7 MG PO TABS
7.0000 mg | ORAL_TABLET | Freq: Every day | ORAL | 6 refills | Status: AC
  Filled 2024-03-04: qty 30, 30d supply, fill #0

## 2024-02-27 MED ORDER — HYDROXYZINE PAMOATE 25 MG PO CAPS: 25.0000 mg | ORAL_CAPSULE | Freq: Two times a day (BID) | ORAL | 0 refills | Status: AC

## 2024-02-27 MED ORDER — PEN NEEDLES 5/16" 31G X 8 MM MISC
2 refills | Status: AC
Start: 2024-02-03 — End: ?

## 2024-02-27 MED ORDER — VRAYLAR 3 MG PO CAPS: 3.0000 mg | ORAL_CAPSULE | Freq: Every evening | ORAL | 0 refills | Status: AC

## 2024-02-28 ENCOUNTER — Other Ambulatory Visit (HOSPITAL_BASED_OUTPATIENT_CLINIC_OR_DEPARTMENT_OTHER): Payer: Self-pay

## 2024-02-28 ENCOUNTER — Other Ambulatory Visit: Payer: Self-pay

## 2024-03-02 ENCOUNTER — Other Ambulatory Visit (HOSPITAL_BASED_OUTPATIENT_CLINIC_OR_DEPARTMENT_OTHER): Payer: Self-pay

## 2024-03-02 ENCOUNTER — Other Ambulatory Visit: Payer: Self-pay

## 2024-03-02 MED ORDER — OMEPRAZOLE 40 MG PO CPDR
40.0000 mg | DELAYED_RELEASE_CAPSULE | Freq: Every day | ORAL | 3 refills | Status: AC
  Filled 2024-03-02: qty 90, 90d supply, fill #0
  Filled 2024-03-04: qty 30, 30d supply, fill #0
  Filled 2024-03-26: qty 30, 30d supply, fill #1
  Filled 2024-04-24 – 2024-04-28 (×3): qty 30, 30d supply, fill #2
  Filled ????-??-??: fill #3

## 2024-03-02 MED ORDER — BUSPIRONE HCL 10 MG PO TABS
10.0000 mg | ORAL_TABLET | Freq: Three times a day (TID) | ORAL | 2 refills | Status: AC
  Filled 2024-03-02: qty 90, 30d supply, fill #0
  Filled 2024-03-26: qty 90, 30d supply, fill #1
  Filled 2024-04-24 – 2024-04-28 (×3): qty 90, 30d supply, fill #2

## 2024-03-02 MED ORDER — PRAZOSIN HCL 1 MG PO CAPS
1.0000 mg | ORAL_CAPSULE | Freq: Every day | ORAL | 0 refills | Status: DC
  Filled 2024-03-02: qty 30, 30d supply, fill #0

## 2024-03-02 MED ORDER — INSULIN PEN NEEDLE 31G X 8 MM MISC
2 refills | Status: AC
  Filled 2024-03-02: qty 100, 100d supply, fill #0

## 2024-03-02 MED ORDER — INSULIN LISPRO 100 UNIT/ML IJ SOLN
100.0000 [IU] | Freq: Every day | INTRAMUSCULAR | 5 refills | Status: AC
Start: 2024-03-02 — End: ?
  Filled 2024-03-02 – 2024-03-30 (×4): qty 10, 10d supply, fill #0

## 2024-03-02 MED ORDER — DIAZEPAM 5 MG PO TABS
5.0000 mg | ORAL_TABLET | Freq: Every day | ORAL | 1 refills | Status: AC | PRN
Start: 2024-03-02 — End: ?
  Filled 2024-03-02: qty 90, 90d supply, fill #0

## 2024-03-02 MED ORDER — MELATONIN 10 MG PO TABS
1.0000 | ORAL_TABLET | Freq: Every evening | ORAL | 1 refills | Status: AC | PRN
  Filled 2024-03-02: qty 90, 90d supply, fill #0

## 2024-03-02 MED ORDER — GUANFACINE HCL ER 4 MG PO TB24
4.0000 mg | ORAL_TABLET | Freq: Every morning | ORAL | 3 refills | Status: AC
  Filled 2024-03-02: qty 30, 30d supply, fill #0
  Filled 2024-03-26: qty 30, 30d supply, fill #1
  Filled 2024-04-24 – 2024-04-28 (×3): qty 30, 30d supply, fill #2
  Filled ????-??-??: fill #3

## 2024-03-02 MED ORDER — VRAYLAR 3 MG PO CAPS
3.0000 mg | ORAL_CAPSULE | Freq: Every evening | ORAL | 0 refills | Status: AC
  Filled 2024-03-02 – 2024-03-04 (×2): qty 90, 90d supply, fill #0

## 2024-03-03 ENCOUNTER — Other Ambulatory Visit: Payer: Self-pay

## 2024-03-03 ENCOUNTER — Other Ambulatory Visit (HOSPITAL_COMMUNITY): Payer: Self-pay

## 2024-03-03 ENCOUNTER — Other Ambulatory Visit (HOSPITAL_BASED_OUTPATIENT_CLINIC_OR_DEPARTMENT_OTHER): Payer: Self-pay

## 2024-03-04 ENCOUNTER — Other Ambulatory Visit: Payer: Self-pay

## 2024-03-04 ENCOUNTER — Other Ambulatory Visit (HOSPITAL_BASED_OUTPATIENT_CLINIC_OR_DEPARTMENT_OTHER): Payer: Self-pay

## 2024-03-05 ENCOUNTER — Other Ambulatory Visit (HOSPITAL_BASED_OUTPATIENT_CLINIC_OR_DEPARTMENT_OTHER): Payer: Self-pay

## 2024-03-09 ENCOUNTER — Other Ambulatory Visit (HOSPITAL_BASED_OUTPATIENT_CLINIC_OR_DEPARTMENT_OTHER): Payer: Self-pay

## 2024-03-09 MED ORDER — HUMALOG 100 UNIT/ML IJ SOLN
INTRAMUSCULAR | 11 refills | Status: DC
  Filled 2024-03-09 – 2024-03-13 (×2): qty 20, 40d supply, fill #0

## 2024-03-13 ENCOUNTER — Other Ambulatory Visit (HOSPITAL_BASED_OUTPATIENT_CLINIC_OR_DEPARTMENT_OTHER): Payer: Self-pay

## 2024-03-26 ENCOUNTER — Other Ambulatory Visit: Payer: Self-pay

## 2024-03-26 ENCOUNTER — Other Ambulatory Visit (HOSPITAL_BASED_OUTPATIENT_CLINIC_OR_DEPARTMENT_OTHER): Payer: Self-pay

## 2024-03-27 ENCOUNTER — Other Ambulatory Visit: Payer: Self-pay

## 2024-03-27 ENCOUNTER — Other Ambulatory Visit (HOSPITAL_BASED_OUTPATIENT_CLINIC_OR_DEPARTMENT_OTHER): Payer: Self-pay

## 2024-03-29 ENCOUNTER — Other Ambulatory Visit: Payer: Self-pay

## 2024-03-30 ENCOUNTER — Other Ambulatory Visit (HOSPITAL_BASED_OUTPATIENT_CLINIC_OR_DEPARTMENT_OTHER): Payer: Self-pay

## 2024-03-30 MED ORDER — MELATONIN 10 MG PO TABS: 10.0000 | ORAL_TABLET | Freq: Every evening | ORAL | 1 refills | Status: AC | PRN

## 2024-03-30 MED ORDER — INSULIN LISPRO 100 UNIT/ML IJ SOLN
INTRAMUSCULAR | 5 refills | Status: AC
  Filled 2024-03-30 – 2024-04-07 (×3): qty 10, 10d supply, fill #0
  Filled 2024-04-14 – 2024-04-17 (×2): qty 10, 10d supply, fill #1

## 2024-03-30 MED ORDER — GUANFACINE HCL ER 4 MG PO TB24: 4.0000 mg | ORAL_TABLET | Freq: Every morning | ORAL | 3 refills | Status: AC

## 2024-03-30 MED ORDER — INSULIN PEN NEEDLE 31G X 5 MM MISC: 2 refills | Status: AC

## 2024-03-30 MED ORDER — BUSPIRONE HCL 10 MG PO TABS
10.0000 mg | ORAL_TABLET | Freq: Three times a day (TID) | ORAL | 2 refills | Status: AC
  Filled 2024-03-30: qty 90, 30d supply, fill #0

## 2024-03-30 MED ORDER — ARISTADA 882 MG/3.2ML IM PRSY
PREFILLED_SYRINGE | INTRAMUSCULAR | 3 refills | Status: DC
  Filled 2024-04-22: qty 3.2, 28d supply, fill #0

## 2024-03-30 MED ORDER — DIAZEPAM 5 MG PO TABS: 5.0000 mg | ORAL_TABLET | Freq: Every day | ORAL | 1 refills | Status: AC | PRN

## 2024-03-30 MED ORDER — OMEPRAZOLE 40 MG PO CPDR: 40.0000 mg | DELAYED_RELEASE_CAPSULE | Freq: Every day | ORAL | 3 refills | Status: AC

## 2024-03-30 MED ORDER — VRAYLAR 3 MG PO CAPS: 3.0000 mg | ORAL_CAPSULE | Freq: Every evening | ORAL | 0 refills | Status: AC

## 2024-03-30 MED ORDER — PRAZOSIN HCL 1 MG PO CAPS
1.0000 mg | ORAL_CAPSULE | Freq: Every day | ORAL | 0 refills | Status: AC
  Filled 2024-04-24 – 2024-04-28 (×3): qty 30, 30d supply, fill #0

## 2024-04-06 ENCOUNTER — Other Ambulatory Visit (HOSPITAL_BASED_OUTPATIENT_CLINIC_OR_DEPARTMENT_OTHER): Payer: Self-pay

## 2024-04-07 ENCOUNTER — Other Ambulatory Visit (HOSPITAL_BASED_OUTPATIENT_CLINIC_OR_DEPARTMENT_OTHER): Payer: Self-pay

## 2024-04-14 ENCOUNTER — Other Ambulatory Visit (HOSPITAL_BASED_OUTPATIENT_CLINIC_OR_DEPARTMENT_OTHER): Payer: Self-pay

## 2024-04-17 ENCOUNTER — Other Ambulatory Visit (HOSPITAL_BASED_OUTPATIENT_CLINIC_OR_DEPARTMENT_OTHER): Payer: Self-pay

## 2024-04-17 ENCOUNTER — Other Ambulatory Visit: Payer: Self-pay

## 2024-04-22 ENCOUNTER — Other Ambulatory Visit (HOSPITAL_BASED_OUTPATIENT_CLINIC_OR_DEPARTMENT_OTHER): Payer: Self-pay

## 2024-04-22 ENCOUNTER — Other Ambulatory Visit: Payer: Self-pay

## 2024-04-22 NOTE — Progress Notes (Deleted)
 Waiting on [facility] to reverse claim

## 2024-04-23 ENCOUNTER — Other Ambulatory Visit (HOSPITAL_COMMUNITY): Payer: Self-pay

## 2024-04-23 ENCOUNTER — Other Ambulatory Visit: Payer: Self-pay

## 2024-04-23 NOTE — Progress Notes (Signed)
 Patient is going to talk to doctor about alternative options. Dis-enrolling at this time.

## 2024-04-23 NOTE — Progress Notes (Signed)
 Pharmacy Patient Advocate Encounter  Insurance verification completed.   The patient is insured through [facility]   Ran test claim for Aristada . Co-pay is $542. Patient has copay card.

## 2024-04-24 ENCOUNTER — Other Ambulatory Visit: Payer: Self-pay

## 2024-04-24 ENCOUNTER — Other Ambulatory Visit (HOSPITAL_BASED_OUTPATIENT_CLINIC_OR_DEPARTMENT_OTHER): Payer: Self-pay

## 2024-04-27 ENCOUNTER — Other Ambulatory Visit (HOSPITAL_BASED_OUTPATIENT_CLINIC_OR_DEPARTMENT_OTHER): Payer: Self-pay

## 2024-04-27 ENCOUNTER — Other Ambulatory Visit: Payer: Self-pay

## 2024-04-28 ENCOUNTER — Other Ambulatory Visit (HOSPITAL_COMMUNITY): Payer: Self-pay

## 2024-04-28 ENCOUNTER — Other Ambulatory Visit (HOSPITAL_BASED_OUTPATIENT_CLINIC_OR_DEPARTMENT_OTHER): Payer: Self-pay

## 2024-04-28 ENCOUNTER — Other Ambulatory Visit: Payer: Self-pay

## 2024-04-29 ENCOUNTER — Other Ambulatory Visit (HOSPITAL_BASED_OUTPATIENT_CLINIC_OR_DEPARTMENT_OTHER): Payer: Self-pay

## 2024-04-29 ENCOUNTER — Other Ambulatory Visit: Payer: Self-pay

## 2024-04-29 ENCOUNTER — Other Ambulatory Visit (HOSPITAL_COMMUNITY): Payer: Self-pay

## 2024-04-29 MED ORDER — MELATONIN 10 MG PO TABS
10.0000 mg | ORAL_TABLET | Freq: Every evening | ORAL | 1 refills | Status: DC | PRN
  Filled 2024-04-29: qty 90, 90d supply, fill #0
  Filled 2024-05-01: qty 100, 100d supply, fill #0

## 2024-04-29 MED ORDER — OMEPRAZOLE 40 MG PO CPDR
40.0000 mg | DELAYED_RELEASE_CAPSULE | Freq: Every day | ORAL | 3 refills | Status: AC
  Filled 2024-04-29: qty 90, 90d supply, fill #0

## 2024-04-29 MED ORDER — GUANFACINE HCL ER 4 MG PO TB24
4.0000 mg | ORAL_TABLET | Freq: Every morning | ORAL | 3 refills | Status: AC
  Filled 2024-04-29: qty 30, 30d supply, fill #0

## 2024-04-29 MED ORDER — INSULIN LISPRO 100 UNIT/ML IJ SOLN
100.0000 [IU] | Freq: Every day | INTRAMUSCULAR | 5 refills | Status: AC
  Filled 2024-04-29: qty 10, 10d supply, fill #0
  Filled 2024-05-15: qty 10, 10d supply, fill #1

## 2024-04-29 MED ORDER — BUSPIRONE HCL 10 MG PO TABS
10.0000 mg | ORAL_TABLET | Freq: Three times a day (TID) | ORAL | 2 refills | Status: AC
  Filled 2024-04-29: qty 90, 30d supply, fill #0

## 2024-04-30 ENCOUNTER — Other Ambulatory Visit: Payer: Self-pay

## 2024-04-30 ENCOUNTER — Other Ambulatory Visit (HOSPITAL_COMMUNITY): Payer: Self-pay

## 2024-04-30 ENCOUNTER — Other Ambulatory Visit (HOSPITAL_BASED_OUTPATIENT_CLINIC_OR_DEPARTMENT_OTHER): Payer: Self-pay

## 2024-04-30 MED ORDER — TRAZODONE HCL 50 MG PO TABS
50.0000 mg | ORAL_TABLET | Freq: Every evening | ORAL | 0 refills | Status: AC
  Filled 2024-04-30: qty 30, 30d supply, fill #0
  Filled ????-??-??: fill #1

## 2024-04-30 MED ORDER — PRAZOSIN HCL 1 MG PO CAPS
1.0000 mg | ORAL_CAPSULE | Freq: Every evening | ORAL | 0 refills | Status: AC
  Filled ????-??-??: fill #0

## 2024-04-30 MED ORDER — ABILIFY ASIMTUFII 720 MG/2.4ML IM PRSY: 2.4000 mL | PREFILLED_SYRINGE | INTRAMUSCULAR | 0 refills | Status: DC

## 2024-04-30 MED ORDER — VRAYLAR 3 MG PO CAPS
3.0000 mg | ORAL_CAPSULE | Freq: Every evening | ORAL | 0 refills | Status: AC
  Filled 2024-04-30: qty 90, 90d supply, fill #0

## 2024-04-30 MED ORDER — DIAZEPAM 5 MG PO TABS
5.0000 mg | ORAL_TABLET | Freq: Every day | ORAL | 1 refills | Status: AC | PRN
  Filled 2024-04-30: qty 90, 90d supply, fill #0

## 2024-04-30 MED ORDER — "INSULIN SYRINGE-NEEDLE U-100 31G X 5/16"" 1 ML MISC": 2 refills | Status: AC

## 2024-05-01 ENCOUNTER — Other Ambulatory Visit (HOSPITAL_BASED_OUTPATIENT_CLINIC_OR_DEPARTMENT_OTHER): Payer: Self-pay

## 2024-05-04 ENCOUNTER — Other Ambulatory Visit (HOSPITAL_COMMUNITY): Payer: Self-pay

## 2024-05-09 ENCOUNTER — Other Ambulatory Visit (HOSPITAL_BASED_OUTPATIENT_CLINIC_OR_DEPARTMENT_OTHER): Payer: Self-pay

## 2024-05-15 ENCOUNTER — Other Ambulatory Visit: Payer: Self-pay

## 2024-05-15 ENCOUNTER — Other Ambulatory Visit (HOSPITAL_BASED_OUTPATIENT_CLINIC_OR_DEPARTMENT_OTHER): Payer: Self-pay

## 2024-05-15 MED ORDER — VRAYLAR 3 MG PO CAPS
3.0000 mg | ORAL_CAPSULE | Freq: Every evening | ORAL | 0 refills | Status: AC
  Filled 2024-05-15: qty 90, 90d supply, fill #0
  Filled ????-??-??: fill #0

## 2024-05-15 MED ORDER — ARISTADA 882 MG/3.2ML IM PRSY
882.0000 mg | PREFILLED_SYRINGE | INTRAMUSCULAR | 3 refills | Status: DC
  Filled 2024-05-15: qty 3.2, 30d supply, fill #0
  Filled 2024-05-19: qty 3.2, 28d supply, fill #0

## 2024-05-15 MED ORDER — GUANFACINE HCL ER 4 MG PO TB24
4.0000 mg | ORAL_TABLET | Freq: Every morning | ORAL | 3 refills | Status: AC
  Filled 2024-05-15: qty 30, 30d supply, fill #0

## 2024-05-15 MED ORDER — OMEPRAZOLE 40 MG PO CPDR
40.0000 mg | DELAYED_RELEASE_CAPSULE | Freq: Every day | ORAL | 3 refills | Status: AC
  Filled 2024-05-15: qty 90, 90d supply, fill #0

## 2024-05-15 MED ORDER — DIAZEPAM 5 MG PO TABS
5.0000 mg | ORAL_TABLET | Freq: Every day | ORAL | 1 refills | Status: AC | PRN
  Filled 2024-05-15: qty 90, 90d supply, fill #0

## 2024-05-15 MED ORDER — BUSPIRONE HCL 10 MG PO TABS
10.0000 mg | ORAL_TABLET | Freq: Three times a day (TID) | ORAL | 2 refills | Status: AC
  Filled 2024-05-15: qty 90, 30d supply, fill #0
  Filled ????-??-??: fill #0

## 2024-05-15 MED ORDER — ABILIFY ASIMTUFII 720 MG/2.4ML IM PRSY
2.4000 mL | PREFILLED_SYRINGE | INTRAMUSCULAR | 0 refills | Status: AC
  Filled 2024-05-15 – 2024-05-20 (×3): qty 2.4, 60d supply, fill #0

## 2024-05-15 MED ORDER — TRAZODONE HCL 50 MG PO TABS
50.0000 mg | ORAL_TABLET | Freq: Every day | ORAL | 2 refills | Status: AC
  Filled 2024-05-15: qty 60, 60d supply, fill #0

## 2024-05-15 MED ORDER — INSULIN LISPRO 100 UNIT/ML IJ SOLN
100.0000 [IU] | INTRAMUSCULAR | 5 refills | Status: AC
  Filled 2024-05-15: qty 10, 10d supply, fill #0

## 2024-05-15 MED ORDER — PRAZOSIN HCL 1 MG PO CAPS
1.0000 mg | ORAL_CAPSULE | Freq: Every day | ORAL | 2 refills | Status: AC
  Filled 2024-05-15: qty 30, 30d supply, fill #0

## 2024-05-15 MED ORDER — EMBECTA PEN NEEDLE ULTRAFINE 31G X 8 MM MISC
2 refills | Status: AC
  Filled 2024-05-15: qty 100, 90d supply, fill #0

## 2024-05-15 MED ORDER — MELATONIN 10 MG PO TABS: 10.0000 mg | ORAL_TABLET | Freq: Every evening | ORAL | 1 refills | Status: AC | PRN

## 2024-05-18 ENCOUNTER — Other Ambulatory Visit (HOSPITAL_BASED_OUTPATIENT_CLINIC_OR_DEPARTMENT_OTHER): Payer: Self-pay

## 2024-05-18 ENCOUNTER — Other Ambulatory Visit: Payer: Self-pay

## 2024-05-19 ENCOUNTER — Other Ambulatory Visit: Payer: Self-pay

## 2024-05-19 ENCOUNTER — Other Ambulatory Visit (HOSPITAL_BASED_OUTPATIENT_CLINIC_OR_DEPARTMENT_OTHER): Payer: Self-pay

## 2024-05-19 NOTE — Progress Notes (Signed)
 Specialty Pharmacy Initial Fill Coordination Note  Danny Werner is a 29 y.o. male contacted today regarding initial fill of specialty medication(s) ARIPiprazole  (Abilify  Asimtufii, ABILIFY )   Patient requested Delivery   Delivery date: 05/26/24   Verified address: [facility]   Medication will be filled on: 05/25/24   Patient is aware of $0 copayment.

## 2024-05-19 NOTE — Progress Notes (Signed)
 Pharmacy Patient Advocate Encounter  Insurance verification completed.   The patient is insured through [facility]   Ran test claim for Abilify  Asimtufii. Co-pay is $0.   This test claim was processed through [facility]- copay amounts may vary at other pharmacies due to pharmacy/plan contracts, or as the patient moves through the different stages of their insurance plan.

## 2024-05-20 ENCOUNTER — Other Ambulatory Visit: Payer: Self-pay

## 2024-05-21 ENCOUNTER — Other Ambulatory Visit: Payer: Self-pay

## 2024-05-21 ENCOUNTER — Other Ambulatory Visit (HOSPITAL_BASED_OUTPATIENT_CLINIC_OR_DEPARTMENT_OTHER): Payer: Self-pay

## 2024-05-22 ENCOUNTER — Other Ambulatory Visit (HOSPITAL_COMMUNITY): Payer: Self-pay
